# Patient Record
Sex: Female | Born: 1935 | Race: White | Hispanic: No | Smoking: Never smoker
Health system: Southern US, Community
[De-identification: ages and names within clinical notes are randomized; demographics above are authoritative.]

## PROBLEM LIST (undated history)

## (undated) DIAGNOSIS — R0601 Orthopnea: Secondary | ICD-10-CM

## (undated) DIAGNOSIS — F329 Major depressive disorder, single episode, unspecified: Secondary | ICD-10-CM

## (undated) DIAGNOSIS — R55 Syncope and collapse: Secondary | ICD-10-CM

## (undated) DIAGNOSIS — F32A Depression, unspecified: Secondary | ICD-10-CM

## (undated) DIAGNOSIS — I209 Angina pectoris, unspecified: Secondary | ICD-10-CM

## (undated) DIAGNOSIS — I10 Essential (primary) hypertension: Secondary | ICD-10-CM

## (undated) DIAGNOSIS — I499 Cardiac arrhythmia, unspecified: Secondary | ICD-10-CM

## (undated) DIAGNOSIS — R609 Edema, unspecified: Secondary | ICD-10-CM

## (undated) DIAGNOSIS — K649 Unspecified hemorrhoids: Secondary | ICD-10-CM

## (undated) DIAGNOSIS — K921 Melena: Secondary | ICD-10-CM

## (undated) DIAGNOSIS — I4891 Unspecified atrial fibrillation: Secondary | ICD-10-CM

## (undated) DIAGNOSIS — R062 Wheezing: Secondary | ICD-10-CM

## (undated) DIAGNOSIS — M1990 Unspecified osteoarthritis, unspecified site: Secondary | ICD-10-CM

## (undated) DIAGNOSIS — E785 Hyperlipidemia, unspecified: Secondary | ICD-10-CM

## (undated) DIAGNOSIS — C801 Malignant (primary) neoplasm, unspecified: Secondary | ICD-10-CM

## (undated) DIAGNOSIS — R002 Palpitations: Secondary | ICD-10-CM

## (undated) DIAGNOSIS — K219 Gastro-esophageal reflux disease without esophagitis: Secondary | ICD-10-CM

## (undated) DIAGNOSIS — IMO0001 Reserved for inherently not codable concepts without codable children: Secondary | ICD-10-CM

## (undated) DIAGNOSIS — K5790 Diverticulosis of intestine, part unspecified, without perforation or abscess without bleeding: Secondary | ICD-10-CM

## (undated) HISTORY — PX: FOOT SURGERY: SHX648

## (undated) HISTORY — PX: KNEE SURGERY: SHX244

## (undated) HISTORY — PX: NECK SURGERY: SHX720

## (undated) HISTORY — DX: Hyperlipidemia, unspecified: E78.5

## (undated) HISTORY — PX: EYE SURGERY: SHX253

## (undated) HISTORY — PX: BACK SURGERY: SHX140

## (undated) HISTORY — PX: ABDOMINAL HYSTERECTOMY: SHX81

## (undated) HISTORY — PX: JOINT REPLACEMENT: SHX530

## (undated) HISTORY — PX: TOTAL HIP ARTHROPLASTY: SHX124

## (undated) HISTORY — DX: Diverticulosis of intestine, part unspecified, without perforation or abscess without bleeding: K57.90

## (undated) HISTORY — DX: Gastro-esophageal reflux disease without esophagitis: K21.9

## (undated) HISTORY — DX: Essential (primary) hypertension: I10

## (undated) HISTORY — DX: Unspecified osteoarthritis, unspecified site: M19.90

## (undated) HISTORY — DX: Unspecified hemorrhoids: K64.9

## (undated) HISTORY — DX: Malignant (primary) neoplasm, unspecified: C80.1

## (undated) HISTORY — DX: Unspecified atrial fibrillation: I48.91

## (undated) HISTORY — DX: Melena: K92.1

---

## 2003-10-15 ENCOUNTER — Other Ambulatory Visit: Payer: Self-pay

## 2004-11-04 ENCOUNTER — Ambulatory Visit: Payer: Self-pay | Admitting: Internal Medicine

## 2005-06-03 ENCOUNTER — Emergency Department: Payer: Self-pay | Admitting: Emergency Medicine

## 2005-06-03 ENCOUNTER — Other Ambulatory Visit: Payer: Self-pay

## 2005-06-08 ENCOUNTER — Other Ambulatory Visit: Payer: Self-pay

## 2005-06-22 ENCOUNTER — Inpatient Hospital Stay: Payer: Self-pay | Admitting: Unknown Physician Specialty

## 2005-07-28 ENCOUNTER — Encounter: Payer: Self-pay | Admitting: Unknown Physician Specialty

## 2005-08-24 ENCOUNTER — Encounter: Payer: Self-pay | Admitting: Unknown Physician Specialty

## 2005-11-26 ENCOUNTER — Ambulatory Visit: Payer: Self-pay | Admitting: Internal Medicine

## 2006-09-16 ENCOUNTER — Ambulatory Visit: Payer: Self-pay | Admitting: Unknown Physician Specialty

## 2007-01-25 ENCOUNTER — Ambulatory Visit: Payer: Self-pay | Admitting: Internal Medicine

## 2008-01-31 ENCOUNTER — Ambulatory Visit: Payer: Self-pay | Admitting: Internal Medicine

## 2008-05-03 ENCOUNTER — Encounter: Payer: Self-pay | Admitting: Unknown Physician Specialty

## 2008-11-30 ENCOUNTER — Ambulatory Visit: Payer: Self-pay | Admitting: Unknown Physician Specialty

## 2009-04-23 ENCOUNTER — Ambulatory Visit: Payer: Self-pay | Admitting: Internal Medicine

## 2009-06-19 ENCOUNTER — Ambulatory Visit: Payer: Self-pay | Admitting: Unknown Physician Specialty

## 2009-07-03 ENCOUNTER — Inpatient Hospital Stay: Payer: Self-pay | Admitting: Unknown Physician Specialty

## 2009-11-02 ENCOUNTER — Ambulatory Visit: Payer: Self-pay | Admitting: Internal Medicine

## 2009-11-29 ENCOUNTER — Ambulatory Visit: Payer: Self-pay | Admitting: Unknown Physician Specialty

## 2010-01-14 ENCOUNTER — Ambulatory Visit: Payer: Self-pay | Admitting: Rheumatology

## 2010-02-28 ENCOUNTER — Emergency Department: Payer: Self-pay | Admitting: Emergency Medicine

## 2010-03-02 ENCOUNTER — Emergency Department: Payer: Self-pay | Admitting: Internal Medicine

## 2010-05-27 ENCOUNTER — Ambulatory Visit: Payer: Self-pay | Admitting: Internal Medicine

## 2011-04-05 IMAGING — CR DG CHEST 2V
1 series · 2 of 2 positions shown · non-contrast
Comparison: none

REASON FOR EXAM: htn
COMMENTS:

[Series 1: view not recorded · 0.17mm/px · 2 of 2 slices shown]
[im 1/2]
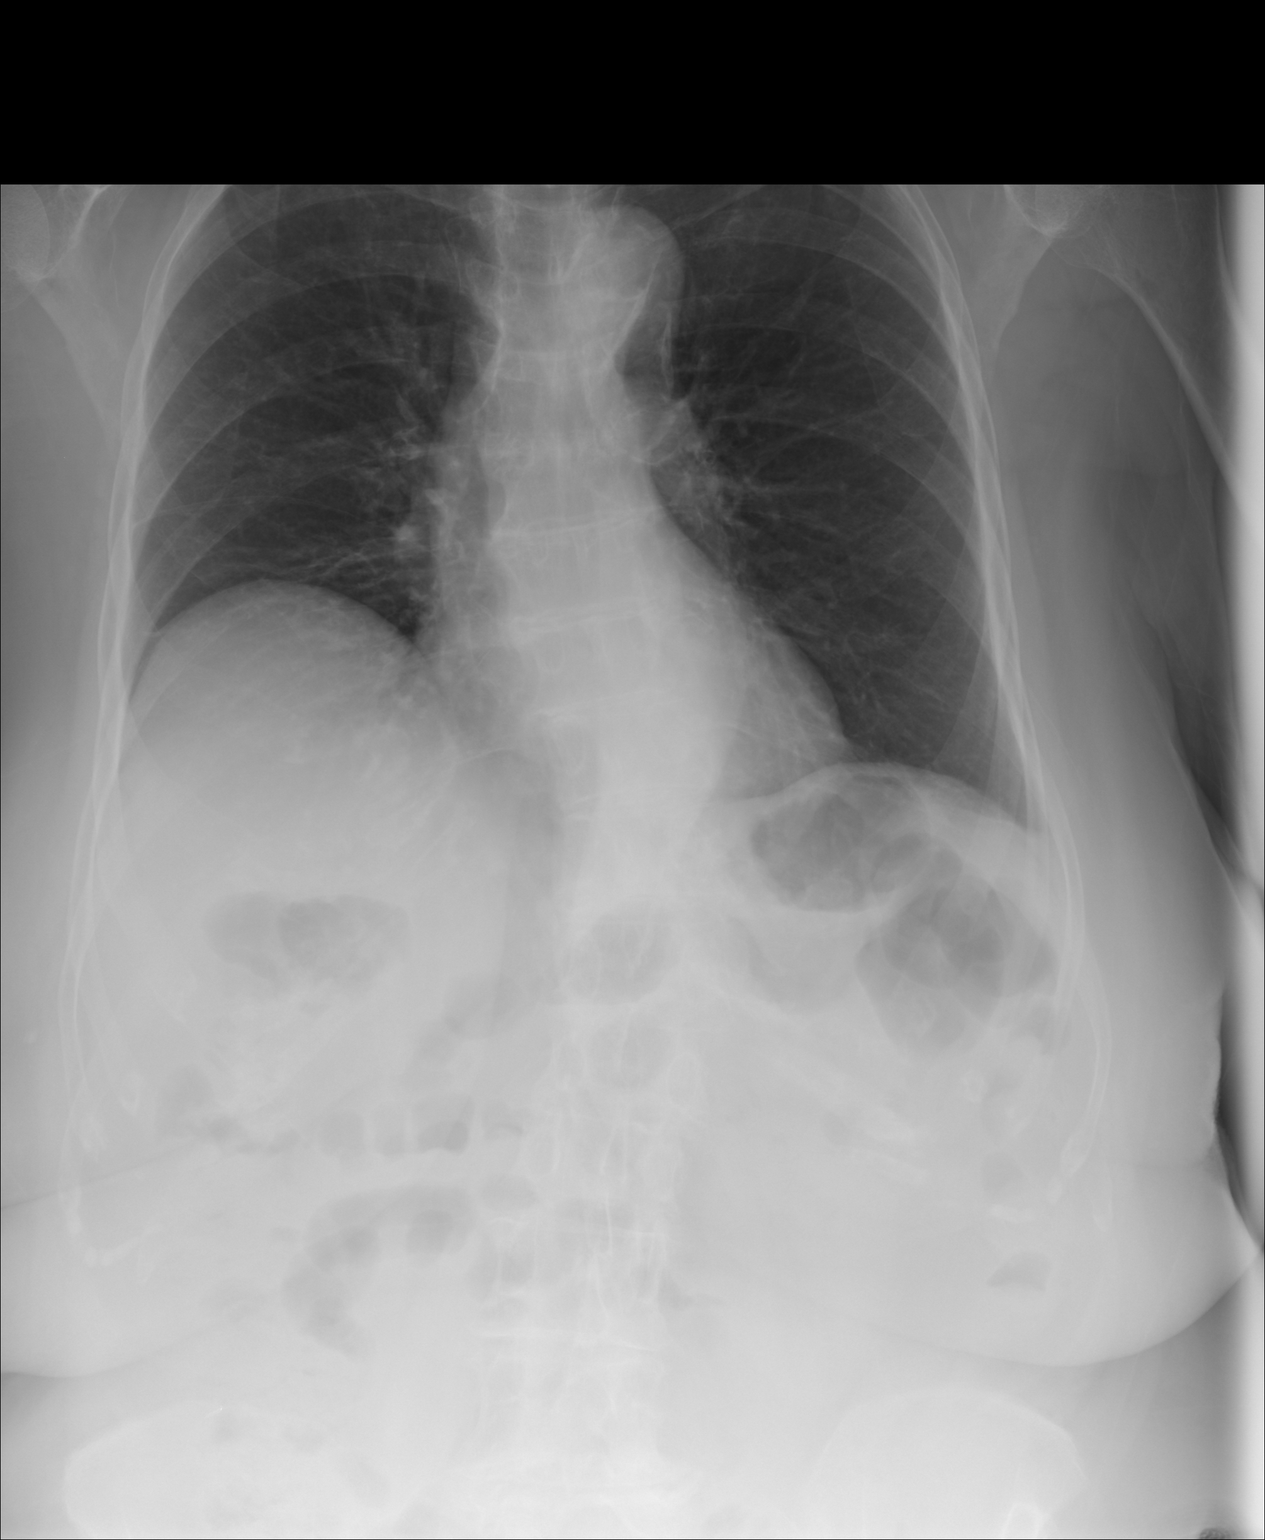
[im 2/2]
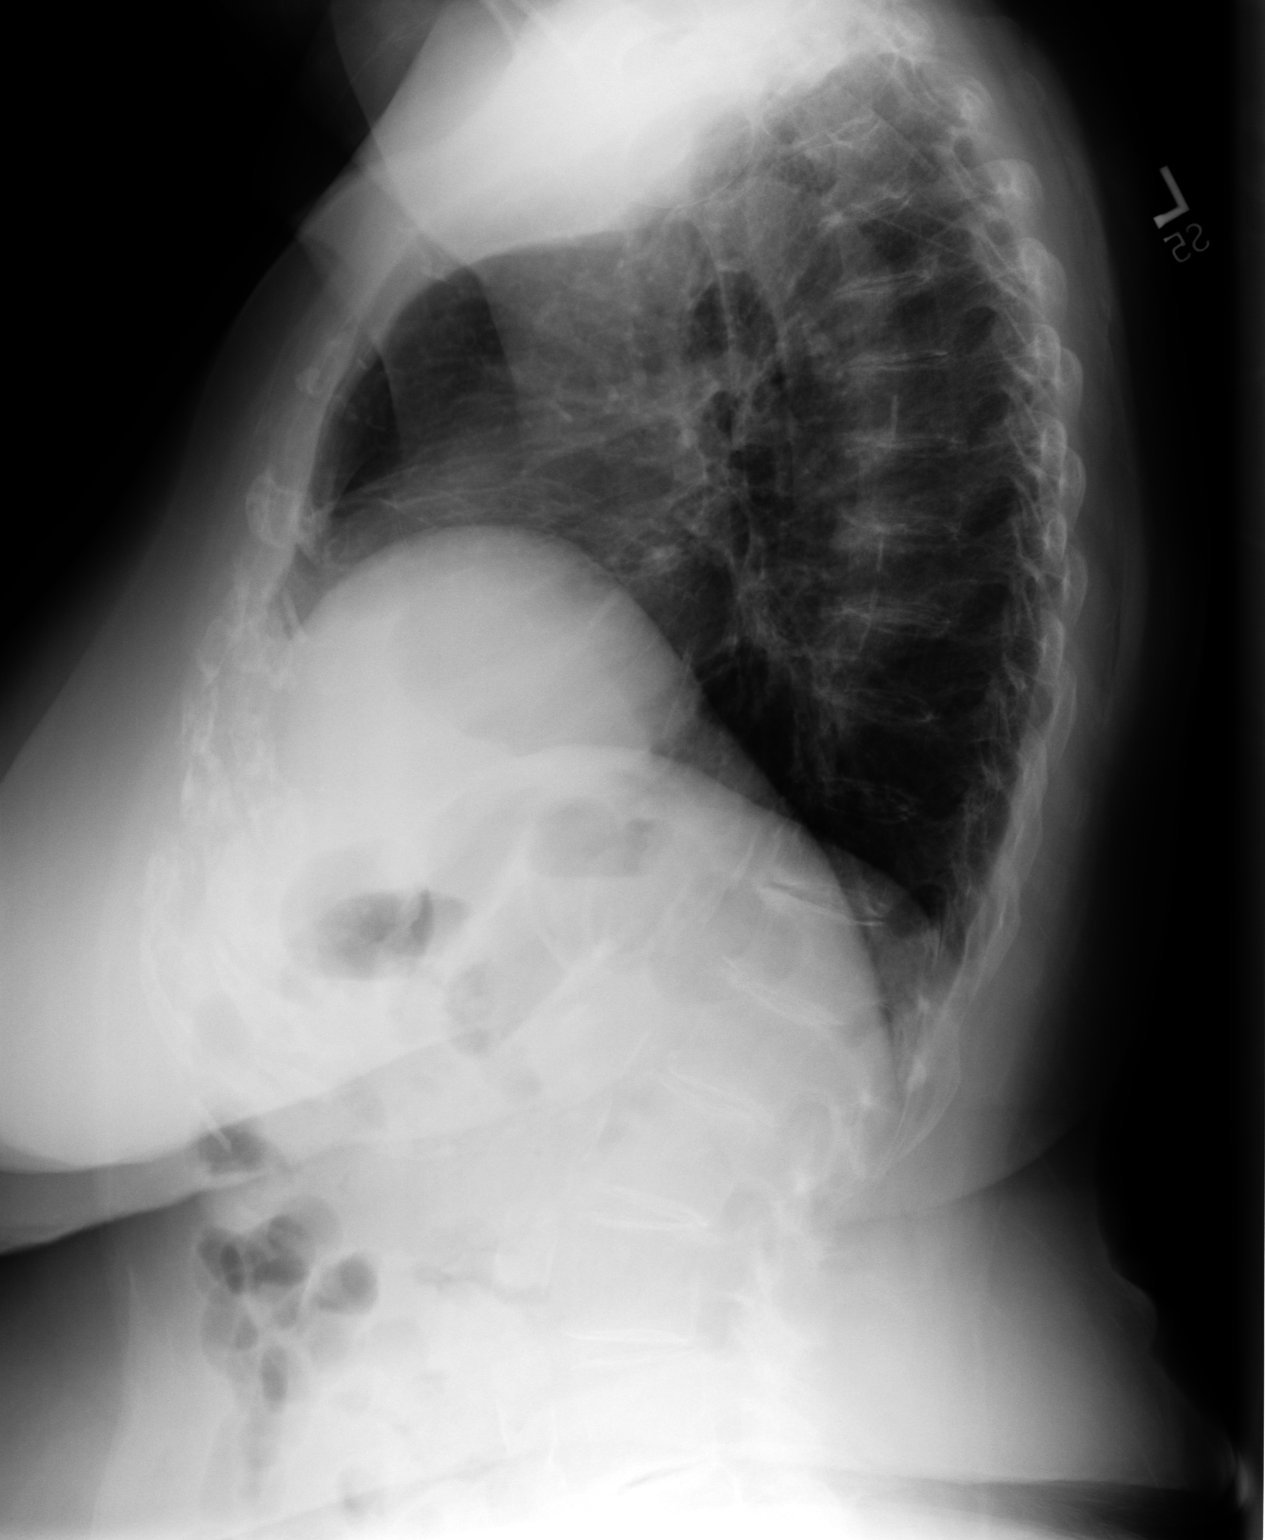

[2 of 2 positions shown; findings below may reference images not displayed]

PROCEDURE:     DXR - DXR CHEST PA (OR AP) AND LATERAL  - June 19, 2009  [DATE]

RESULT:     The left lung is well expanded and clear. The right
hemidiaphragm is elevated. The aerated portion of the right lung appears
clear. There is no pleural effusion. The cardiac silhouette is normal in
size. There is mild tortuosity of the descending thoracic aorta. There is
curvature of the thoracolumbar spine. There are degenerative changes of the
thoracic spine.
IMPRESSION: There is elevation of the right hemidiaphragm. The lungs
appear clear and the heart and pulmonary vascularity are normal in
appearance.

## 2011-05-28 ENCOUNTER — Ambulatory Visit: Payer: Self-pay | Admitting: Unknown Physician Specialty

## 2011-07-18 ENCOUNTER — Emergency Department: Payer: Self-pay | Admitting: Emergency Medicine

## 2011-10-06 ENCOUNTER — Ambulatory Visit: Payer: Self-pay | Admitting: Internal Medicine

## 2012-09-06 ENCOUNTER — Ambulatory Visit: Payer: Self-pay | Admitting: Internal Medicine

## 2012-10-20 ENCOUNTER — Ambulatory Visit: Payer: Self-pay | Admitting: Internal Medicine

## 2013-03-16 ENCOUNTER — Emergency Department: Payer: Self-pay | Admitting: Emergency Medicine

## 2013-03-16 LAB — COMPREHENSIVE METABOLIC PANEL
Anion Gap: 4 — ABNORMAL LOW (ref 7–16)
BUN: 25 mg/dL — ABNORMAL HIGH (ref 7–18)
Bilirubin,Total: 0.4 mg/dL (ref 0.2–1.0)
Creatinine: 1.36 mg/dL — ABNORMAL HIGH (ref 0.60–1.30)
Glucose: 96 mg/dL (ref 65–99)
Osmolality: 269 (ref 275–301)
SGOT(AST): 26 U/L (ref 15–37)
SGPT (ALT): 18 U/L (ref 12–78)
Sodium: 132 mmol/L — ABNORMAL LOW (ref 136–145)
Total Protein: 7.6 g/dL (ref 6.4–8.2)

## 2013-03-16 LAB — URINALYSIS, COMPLETE
Bacteria: NONE SEEN
Bilirubin,UR: NEGATIVE
Ph: 6 (ref 4.5–8.0)
Protein: NEGATIVE
RBC,UR: 1 /HPF (ref 0–5)
WBC UR: NONE SEEN /HPF (ref 0–5)

## 2013-03-16 LAB — CBC
HCT: 40.8 % (ref 35.0–47.0)
HGB: 13.6 g/dL (ref 12.0–16.0)
MCH: 31.2 pg (ref 26.0–34.0)
RBC: 4.36 10*6/uL (ref 3.80–5.20)

## 2013-06-08 ENCOUNTER — Inpatient Hospital Stay: Payer: Self-pay | Admitting: Internal Medicine

## 2013-06-08 LAB — URINALYSIS, COMPLETE
Bilirubin,UR: NEGATIVE
Leukocyte Esterase: NEGATIVE
Ph: 5 (ref 4.5–8.0)
Protein: NEGATIVE
RBC,UR: 1 /HPF (ref 0–5)
Specific Gravity: 1.006 (ref 1.003–1.030)
WBC UR: <1 /HPF (ref 0–5)

## 2013-06-08 LAB — CBC WITH DIFFERENTIAL/PLATELET
HCT: 40.4 % (ref 35.0–47.0)
HGB: 13.5 g/dL (ref 12.0–16.0)
Lymphocyte %: 17.5 %
MCH: 31.5 pg (ref 26.0–34.0)
MCHC: 33.6 g/dL (ref 32.0–36.0)
MCV: 94 fL (ref 80–100)
Monocyte #: 0.6 x10 3/mm (ref 0.2–0.9)
Monocyte %: 6.9 %
Platelet: 269 10*3/uL (ref 150–440)
RBC: 4.3 10*6/uL (ref 3.80–5.20)
RDW: 14.1 % (ref 11.5–14.5)
WBC: 9.3 10*3/uL (ref 3.6–11.0)

## 2013-06-08 LAB — COMPREHENSIVE METABOLIC PANEL
Albumin: 4.3 g/dL (ref 3.4–5.0)
Alkaline Phosphatase: 63 U/L (ref 50–136)
Anion Gap: 7 (ref 7–16)
Bilirubin,Total: 0.3 mg/dL (ref 0.2–1.0)
Calcium, Total: 9.6 mg/dL (ref 8.5–10.1)
Chloride: 98 mmol/L (ref 98–107)
Co2: 28 mmol/L (ref 21–32)
Creatinine: 1.47 mg/dL — ABNORMAL HIGH (ref 0.60–1.30)
EGFR (African American): 37 — ABNORMAL LOW
Potassium: 4.6 mmol/L (ref 3.5–5.1)
SGOT(AST): 24 U/L (ref 15–37)
SGPT (ALT): 18 U/L (ref 12–78)
Sodium: 133 mmol/L — ABNORMAL LOW (ref 136–145)

## 2013-06-09 ENCOUNTER — Telehealth: Payer: Self-pay | Admitting: *Deleted

## 2013-06-09 DIAGNOSIS — I059 Rheumatic mitral valve disease, unspecified: Secondary | ICD-10-CM

## 2013-06-09 DIAGNOSIS — I10 Essential (primary) hypertension: Secondary | ICD-10-CM

## 2013-06-09 DIAGNOSIS — R5383 Other fatigue: Secondary | ICD-10-CM

## 2013-06-09 DIAGNOSIS — I4891 Unspecified atrial fibrillation: Secondary | ICD-10-CM

## 2013-06-09 DIAGNOSIS — R5381 Other malaise: Secondary | ICD-10-CM

## 2013-06-09 DIAGNOSIS — R0602 Shortness of breath: Secondary | ICD-10-CM

## 2013-06-09 LAB — BASIC METABOLIC PANEL
Anion Gap: 4 — ABNORMAL LOW (ref 7–16)
Anion Gap: 3 — ABNORMAL LOW (ref 7–16)
BUN: 23 mg/dL — ABNORMAL HIGH (ref 7–18)
Calcium, Total: 9.6 mg/dL (ref 8.5–10.1)
Co2: 31 mmol/L (ref 21–32)
EGFR (African American): 38 — ABNORMAL LOW
EGFR (Non-African Amer.): 33 — ABNORMAL LOW
EGFR (Non-African Amer.): 33 — ABNORMAL LOW
Glucose: 93 mg/dL (ref 65–99)
Osmolality: 275 (ref 275–301)
Potassium: 4.5 mmol/L (ref 3.5–5.1)
Sodium: 137 mmol/L (ref 136–145)
Sodium: 136 mmol/L (ref 136–145)

## 2013-06-09 LAB — APTT
Activated PTT: 33.1 secs (ref 23.6–35.9)
Activated PTT: 142.5 secs — ABNORMAL HIGH (ref 23.6–35.9)

## 2013-06-09 LAB — LIPID PANEL
HDL Cholesterol: 52 mg/dL (ref 40–60)
VLDL Cholesterol, Calc: 19 mg/dL (ref 5–40)

## 2013-06-09 LAB — CBC WITH DIFFERENTIAL/PLATELET
Basophil #: 0 10*3/uL (ref 0.0–0.1)
Basophil #: 0.1 10*3/uL (ref 0.0–0.1)
Basophil %: 0.6 %
Basophil %: 0.8 %
Eosinophil #: 0.3 10*3/uL (ref 0.0–0.7)
Eosinophil %: 4.4 %
HGB: 11.9 g/dL — ABNORMAL LOW (ref 12.0–16.0)
Lymphocyte #: 1.4 10*3/uL (ref 1.0–3.6)
Lymphocyte %: 16.3 %
Lymphocyte %: 17.5 %
MCH: 31.6 pg (ref 26.0–34.0)
MCHC: 34.1 g/dL (ref 32.0–36.0)
MCHC: 33.6 g/dL (ref 32.0–36.0)
Monocyte #: 0.7 x10 3/mm (ref 0.2–0.9)
Monocyte #: 0.6 x10 3/mm (ref 0.2–0.9)
Monocyte %: 7.6 %
Neutrophil #: 5.5 10*3/uL (ref 1.4–6.5)
Neutrophil #: 5.8 10*3/uL (ref 1.4–6.5)
Neutrophil %: 70 %
Neutrophil %: 70.1 %
Platelet: 234 10*3/uL (ref 150–440)
RBC: 3.71 10*6/uL — ABNORMAL LOW (ref 3.80–5.20)
RDW: 14.2 % (ref 11.5–14.5)
WBC: 8.3 10*3/uL (ref 3.6–11.0)

## 2013-06-09 LAB — TSH: Thyroid Stimulating Horm: 1.74 u[IU]/mL

## 2013-06-09 LAB — CK TOTAL AND CKMB (NOT AT ARMC)
CK, Total: 79 U/L (ref 21–215)
CK, Total: 101 U/L (ref 21–215)
CK-MB: 2.1 ng/mL (ref 0.5–3.6)

## 2013-06-09 LAB — TROPONIN I
Troponin-I: <0.02 ng/mL
Troponin-I: <0.02 ng/mL

## 2013-06-09 NOTE — Telephone Encounter (Signed)
Pt's daughter called from [place].  States that CCU told her to call our office for information on pt's status, as Dr. Mariah Milling was called for consult.  She would appreciate a call from [place], [place], if possible, as he saw pt in consult.  She would like to know if pt's condition is "serious" and if she needs to come be with her mother.

## 2013-06-09 NOTE — Telephone Encounter (Signed)
Please call daughter as her mother has been admitted into [facility]U and wants to know if she need to come to [facility]

## 2013-06-10 LAB — CBC WITH DIFFERENTIAL/PLATELET
HGB: 11.9 g/dL — ABNORMAL LOW (ref 12.0–16.0)
Lymphocyte #: 1.4 10*3/uL (ref 1.0–3.6)
MCH: 31.7 pg (ref 26.0–34.0)
MCHC: 33.6 g/dL (ref 32.0–36.0)
Monocyte #: 0.6 x10 3/mm (ref 0.2–0.9)
Monocyte %: 8.8 %
Neutrophil #: 4.8 10*3/uL (ref 1.4–6.5)
Neutrophil %: 65.4 %
Platelet: 238 10*3/uL (ref 150–440)
RBC: 3.76 10*6/uL — ABNORMAL LOW (ref 3.80–5.20)
RDW: 14 % (ref 11.5–14.5)
WBC: 7.4 10*3/uL (ref 3.6–11.0)

## 2013-06-16 ENCOUNTER — Telehealth: Payer: Self-pay | Admitting: *Deleted

## 2013-06-16 NOTE — Telephone Encounter (Signed)
Spoke w/ pt.  She states that she was recently released from [facility] for rectal bleeding.  Reports bleeding since yesterday, "terrrible yesterday morning", "right much blood on the paper and running down my leg when I stood up". Reports not as bad this morning, but did have a nose bleed and used a "couple of kleenex to stop". After speaking w/ Dr. Mariah Milling, instructed pt to hold Pradaxa until her appt w/ Dr. Mariah Milling on Tuesday.  Pt is agreeable to this plan.

## 2013-06-16 NOTE — Telephone Encounter (Signed)
Dr. Mariah Milling put her on a blood thinner Pradaxa 75mg  at her hospital stay. She has hemmroids and is bleeding worse and has had a nose bleed. Please advise

## 2013-06-20 ENCOUNTER — Encounter: Payer: Self-pay | Admitting: Cardiovascular Disease

## 2013-06-20 ENCOUNTER — Ambulatory Visit (INDEPENDENT_AMBULATORY_CARE_PROVIDER_SITE_OTHER): Payer: Medicare Other | Admitting: Cardiovascular Disease

## 2013-06-20 VITALS — BP 148/72 | HR 75 | Ht 64.0 in | Wt 164.5 lb

## 2013-06-20 DIAGNOSIS — R0602 Shortness of breath: Secondary | ICD-10-CM

## 2013-06-20 DIAGNOSIS — I10 Essential (primary) hypertension: Secondary | ICD-10-CM | POA: Insufficient documentation

## 2013-06-20 DIAGNOSIS — R600 Localized edema: Secondary | ICD-10-CM

## 2013-06-20 DIAGNOSIS — I4891 Unspecified atrial fibrillation: Secondary | ICD-10-CM

## 2013-06-20 DIAGNOSIS — N189 Chronic kidney disease, unspecified: Secondary | ICD-10-CM | POA: Insufficient documentation

## 2013-06-20 DIAGNOSIS — E785 Hyperlipidemia, unspecified: Secondary | ICD-10-CM

## 2013-06-20 DIAGNOSIS — R609 Edema, unspecified: Secondary | ICD-10-CM

## 2013-06-20 MED ORDER — POTASSIUM CHLORIDE CRYS ER 10 MEQ PO TBCR: 10.0000 meq | EXTENDED_RELEASE_TABLET | Freq: Two times a day (BID) | ORAL | Status: DC | PRN

## 2013-06-20 MED ORDER — FUROSEMIDE 20 MG PO TABS: 20.0000 mg | ORAL_TABLET | Freq: Two times a day (BID) | ORAL | Status: DC | PRN

## 2013-06-20 NOTE — Assessment & Plan Note (Signed)
Suspect she has some fluid overload. She reports weight is up. We'll do Lasix twice a day for one week, then down to daily

## 2013-06-20 NOTE — Patient Instructions (Addendum)
You are doing well.  Stool softeners: Try MOM, miralex Colace is ok but weak  Please take lasix twice a day with potassium for leg edema (for one week) Then down to lasix one a day  Call the office to schedule blood work in 2 weeks  Please call us if you have new issues that need to be addressed before your next appt.  Your physician wants you to follow-up in: 1 month.

## 2013-06-20 NOTE — Progress Notes (Signed)
Patient ID: Tabitha Potts, female    DOB: Jan 15, 1926, 79 y.o.   MRN: [id number]  HPI Comments: Tabitha Potts is a very pleasant 79 year old woman with a history of hemorrhoidal bleeding who presented to the hospital 06/09/2013 with GI bleeding felt secondary to hemorrhoids and diverticulitis, new onset atrial fibrillation with RVR.  She reported 3 weeks of fatigue. It was felt that she may have been in atrial fibrillation for several weeks. She was started on Cardizem, initial digoxin but this was later held. Also started on anticoagulation. Norvasc and ACE inhibitor HCTZ combo was held  for low blood pressure. Creatinine in the hospital 1.47, BUN 26   In followup today, she reports that she feels better. Sometimes feels a pulsation in her head, most commonly in the setting of stress. She did have more GI bleeding and she held the anticoagulation with improvement of her symptoms .  Echocardiogram in the hospital 06/09/2013 shows ejection fraction 60-65%, normal right ventricular systolic pressure, mildly dilated left atrium  Hematocrit 06/10/2013 was 35   Notes provided from outside clinic show stress test in 2009  Echocardiogram 04/13/2013 with same results as above   EKG shows normal sinus rhythm with rate 75 beats per minute, first degree AV block. No significant ST or T wave changes   Outpatient Encounter Prescriptions as of 06/20/2013  Medication Sig Dispense Refill  . ALPRAZolam (XANAX) 0.25 MG tablet Take 0.25 mg by mouth at bedtime as needed.       Marland Kitchen aluminum & magnesium hydroxide (MAALOX) 225-200 MG/5ML suspension Take 5 mLs by mouth every 6 (six) hours as needed for indigestion.      Marland Kitchen aspirin 81 MG tablet Take 81 mg by mouth daily.      Marland Kitchen diltiazem (DILACOR XR) 240 MG 24 hr capsule Take 240 mg by mouth daily.      . Multiple Vitamin (MULTIVITAMIN) tablet Take 1 tablet by mouth daily.      . NON FORMULARY Stool Softner 100 mg twice daily.      . potassium chloride (K-DUR,KLOR-CON) 10 MEQ  tablet Take 1 tablet (10 mEq total) by mouth 2 (two) times daily as needed.  60 tablet  6  . rosuvastatin (CRESTOR) 10 MG tablet Take 10 mg by mouth daily.      . furosemide (LASIX) 20 MG tablet Take 20 mg by mouth daily.       . potassium chloride (K-DUR,KLOR-CON) 10 MEQ tablet Take 10 mEq by mouth daily.          Review of Systems  Constitutional: Negative.   HENT: Negative.   Eyes: Negative.   Respiratory: Negative.   Cardiovascular: Negative.   Gastrointestinal: Negative.   Endocrine: Negative.   Musculoskeletal: Negative.   Skin: Negative.   Allergic/Immunologic: Negative.   Neurological: Negative.   Hematological: Negative.   Psychiatric/Behavioral: Negative.   All other systems reviewed and are negative.    BP 148/72  Pulse 75  Ht 5\' 4"  (1.626 m)  Wt 164 lb 8 oz (74.617 kg)  BMI 28.22 kg/m2  Physical Exam  Nursing note and vitals reviewed. Constitutional: She is oriented to person, place, and time. She appears well-developed and well-nourished.  HENT:  Head: Normocephalic.  Nose: Nose normal.  Mouth/Throat: Oropharynx is clear and moist.  Eyes: Conjunctivae are normal. Pupils are equal, round, and reactive to light.  Neck: Normal range of motion. Neck supple. No JVD present.  Cardiovascular: Normal rate, regular rhythm, S1 normal, S2 normal, normal heart sounds  and intact distal pulses.  Exam reveals no gallop and no friction rub.   No murmur heard. Pulmonary/Chest: Effort normal and breath sounds normal. No respiratory distress. She has no wheezes. She has no rales. She exhibits no tenderness.  Abdominal: Soft. Bowel sounds are normal. She exhibits no distension. There is no tenderness.  Musculoskeletal: Normal range of motion. She exhibits no edema and no tenderness.  Lymphadenopathy:    She has no cervical adenopathy.  Neurological: She is alert and oriented to person, place, and time. Coordination normal.  Skin: Skin is warm and dry. No rash noted. No  erythema.  Psychiatric: She has a normal mood and affect. Her behavior is normal. Judgment and thought content normal.    Assessment and Plan

## 2013-06-20 NOTE — Assessment & Plan Note (Signed)
Suggested she stay on her Crestor

## 2013-06-20 NOTE — Assessment & Plan Note (Signed)
We will recheck her renal function in 2 weeks' time given increased Lasix over the next week and underlying renal dysfunction

## 2013-06-20 NOTE — Assessment & Plan Note (Addendum)
On today's visit, she is in normal sinus rhythm. We'll continue her on diltiazem 240 mg daily. For her leg edema, we have recommended she increase her Lasix up to 20 mg twice a day with potassium 10 mEq twice a day for one week. We have suggested she call our office next week. If no improvement with diuresis, we may need to decrease the dose of diltiazem. Unable to tolerate any correlation well given hemorrhoidal bleeding, possibly diverticular bleeding.

## 2013-06-20 NOTE — Assessment & Plan Note (Signed)
For now we have suggested she hold her ACE inhibitor HCTZ combo. Continue diltiazem.

## 2013-06-26 ENCOUNTER — Encounter: Payer: Self-pay | Admitting: *Deleted

## 2013-06-27 ENCOUNTER — Ambulatory Visit (INDEPENDENT_AMBULATORY_CARE_PROVIDER_SITE_OTHER): Payer: Medicare Other

## 2013-06-27 DIAGNOSIS — I4891 Unspecified atrial fibrillation: Secondary | ICD-10-CM

## 2013-06-28 ENCOUNTER — Telehealth: Payer: Self-pay | Admitting: *Deleted

## 2013-06-28 NOTE — Telephone Encounter (Signed)
Patient called and PCP Dr. Valrie Hart put her on 20mg  benecar for hbp

## 2013-07-04 ENCOUNTER — Other Ambulatory Visit: Payer: Medicare Other

## 2013-07-06 ENCOUNTER — Telehealth: Payer: Self-pay | Admitting: *Deleted

## 2013-07-06 ENCOUNTER — Other Ambulatory Visit: Payer: Self-pay | Admitting: *Deleted

## 2013-07-06 MED ORDER — DILTIAZEM HCL ER 240 MG PO CP24: 240.0000 mg | ORAL_CAPSULE | Freq: Every day | ORAL | Status: DC

## 2013-07-06 NOTE — Telephone Encounter (Signed)
Okay could

## 2013-07-06 NOTE — Telephone Encounter (Signed)
Patient called stating that she is doing good bp 117/81 pulse 73.

## 2013-07-06 NOTE — Telephone Encounter (Signed)
Previous note open regarding BP. Will combine and close this note.

## 2013-07-06 NOTE — Telephone Encounter (Signed)
Will forward to MD for review

## 2013-07-06 NOTE — Telephone Encounter (Signed)
Tabitha Potts at 07/06/2013 9:14 AM    Status: Signed        Patient called stating that she is doing good bp 117/81 pulse 73.

## 2013-07-10 ENCOUNTER — Telehealth: Payer: Self-pay

## 2013-07-10 NOTE — Telephone Encounter (Signed)
Spoke w/ pt.  She is agreeable to plan and verbalizes understanding. 

## 2013-07-10 NOTE — Telephone Encounter (Signed)
Wouldn't stay on diltiazem 240 mg daily Would start Lasix as recommended on her visit If leg edema continues to be significant in the next several weeks,  May need to decrease diltiazem as in one of 5 people this can cause leg edema  We do not to Xanax, this should come from primary care  Would make sure she has followup in the next several weeks to look at leg edema This can get worse with time

## 2013-07-10 NOTE — Telephone Encounter (Signed)
Patient was giving at D/C from [facility] on 06/10/2013 with Diltiazem CD 300 mg take one tablet daily. She recently picked up a Rx for Diltiazem xr ER  240 mg take one tablet daily. What dose is she to be taking.  She also would like to know if you would send in xanax until she gets her nerves straightened out.

## 2013-07-24 ENCOUNTER — Ambulatory Visit (INDEPENDENT_AMBULATORY_CARE_PROVIDER_SITE_OTHER): Payer: Medicare Other | Admitting: Cardiovascular Disease

## 2013-07-24 ENCOUNTER — Encounter: Payer: Self-pay | Admitting: Cardiovascular Disease

## 2013-07-24 VITALS — BP 128/62 | HR 86 | Ht 63.0 in | Wt 164.8 lb

## 2013-07-24 DIAGNOSIS — I10 Essential (primary) hypertension: Secondary | ICD-10-CM

## 2013-07-24 DIAGNOSIS — I4891 Unspecified atrial fibrillation: Secondary | ICD-10-CM

## 2013-07-24 DIAGNOSIS — E785 Hyperlipidemia, unspecified: Secondary | ICD-10-CM

## 2013-07-24 MED ORDER — APIXABAN 5 MG PO TABS: 5.0000 mg | ORAL_TABLET | Freq: Two times a day (BID) | ORAL | Status: DC

## 2013-07-24 NOTE — Assessment & Plan Note (Signed)
Encouraged her to stay on her Crestor 

## 2013-07-24 NOTE — Patient Instructions (Addendum)
Please hold the aspirin Tomorrow start eliquis 1/2 pill twice a day for four days,  then increase to a full pill twice a day on Saturday  Please call us if you have new issues that need to be addressed before your next appt.  Your physician wants you to follow-up in: 4 weeks

## 2013-07-24 NOTE — Progress Notes (Signed)
Patient ID: Tabitha Potts, female    DOB: June 05, 1926, 79 y.o.   MRN: [id number]  HPI Comments: Tabitha Potts is a very pleasant 79 year old woman with a history of hemorrhoidal bleeding who presented to the hospital 06/09/2013 with GI bleeding felt secondary to hemorrhoids and diverticulitis, new onset atrial fibrillation with RVR. It was felt she had developed atrial fibrillation in early October 2014. Prior EKGs earlier in 2014, July, had shown normal sinus rhythm.In the hospital, She was started on Cardizem, initial digoxin but this was later held. Also started on anticoagulation. Norvasc and ACE inhibitor HCTZ combo was held  for low blood pressure. Creatinine in the hospital 1.47, BUN 26   In followup today, she continues to have periods of fatigue. Symptoms seem to wax and wane. Otherwise she feels well. She denies any significant lower to come edema, no orthopnea or PND. She is able to exert herself to a moderate degree without having to stop.   On her last clinic visit, She did have more GI bleeding and she held the anticoagulation with improvement of her symptoms .  Echocardiogram in the hospital 06/09/2013 shows ejection fraction 60-65%, normal right ventricular systolic pressure, mildly dilated left atrium  Hematocrit 06/10/2013 was 35   Notes provided from outside clinic show stress test in 2009  Echocardiogram 04/13/2013 with same results as above   EKG shows atrial fibrillation with rates in the 80s Prior EKG 06/20/2013 showed normal sinus rhythm EKG prior to that in the hospital in mid-October showed atrial fibrillation EKG July 2014 showed normal sinus rhythm  Outpatient Encounter Prescriptions as of 07/24/2013  Medication Sig  . ALPRAZolam (XANAX) 0.25 MG tablet Take 0.25 mg by mouth at bedtime as needed.   Marland Kitchen aluminum & magnesium hydroxide (MAALOX) 225-200 MG/5ML suspension Take 5 mLs by mouth every 6 (six) hours as needed for indigestion.  Marland Kitchen aspirin 81 MG tablet Take 81 mg by mouth  daily.  Marland Kitchen diltiazem (DILACOR XR) 240 MG 24 hr capsule Take 1 capsule (240 mg total) by mouth daily.  . furosemide (LASIX) 20 MG tablet Take 1 tablet (20 mg total) by mouth 2 (two) times daily as needed.  . Multiple Vitamin (MULTIVITAMIN) tablet Take 1 tablet by mouth daily.  . NON FORMULARY Stool Softner 100 mg twice daily.  . rosuvastatin (CRESTOR) 10 MG tablet Take 10 mg by mouth daily.  . [DISCONTINUED] potassium chloride (K-DUR,KLOR-CON) 10 MEQ tablet Take 1 tablet (10 mEq total) by mouth 2 (two) times daily as needed.      Review of Systems  Constitutional: Negative.   HENT: Negative.   Eyes: Negative.   Respiratory: Negative.   Cardiovascular: Negative.   Gastrointestinal: Negative.   Endocrine: Negative.   Musculoskeletal: Negative.   Skin: Negative.   Allergic/Immunologic: Negative.   Neurological: Negative.   Hematological: Negative.   Psychiatric/Behavioral: Negative.   All other systems reviewed and are negative.    BP 128/62  Pulse 86  Ht 5\' 3"  (1.6 m)  Wt 164 lb 12 oz (74.73 kg)  BMI 29.19 kg/m2  Physical Exam  Nursing note and vitals reviewed. Constitutional: She is oriented to person, place, and time. She appears well-developed and well-nourished.  HENT:  Head: Normocephalic.  Nose: Nose normal.  Mouth/Throat: Oropharynx is clear and moist.  Eyes: Conjunctivae are normal. Pupils are equal, round, and reactive to light.  Neck: Normal range of motion. Neck supple. No JVD present.  Cardiovascular: Normal rate, regular rhythm, S1 normal, S2 normal, normal heart sounds  and intact distal pulses.  Exam reveals no gallop and no friction rub.   No murmur heard. Pulmonary/Chest: Effort normal and breath sounds normal. No respiratory distress. She has no wheezes. She has no rales. She exhibits no tenderness.  Abdominal: Soft. Bowel sounds are normal. She exhibits no distension. There is no tenderness.  Musculoskeletal: Normal range of motion. She exhibits no edema  and no tenderness.  Lymphadenopathy:    She has no cervical adenopathy.  Neurological: She is alert and oriented to person, place, and time. Coordination normal.  Skin: Skin is warm and dry. No rash noted. No erythema.  Psychiatric: She has a normal mood and affect. Her behavior is normal. Judgment and thought content normal.    Assessment and Plan

## 2013-07-24 NOTE — Assessment & Plan Note (Signed)
She has converted back to atrial fibrillation again. She does have symptoms of fatigue though in general is not able to appreciate her atrial fibrillation. We will hold her aspirin, start her on eliquis 5 mg twice a day as he has paroxysmal arrhythmia.

## 2013-07-24 NOTE — Assessment & Plan Note (Signed)
Blood pressure is well controlled on today's visit. No changes made to the medications. 

## 2013-07-28 ENCOUNTER — Telehealth: Payer: Self-pay | Admitting: *Deleted

## 2013-07-28 NOTE — Telephone Encounter (Signed)
Patient calling concerning her blood pressure and heart rate: Blood pressure this morning was 167/110. Would like to speak with Dr. Windell Hummingbird RN

## 2013-07-28 NOTE — Telephone Encounter (Signed)
Spoke w/ pt.  She reports that she does not normally check her BP, but her daughter is a doctor in [facility] and asked her to check it today. Reports that she did not feel good this morning, felt "draggy" and didn't want to eat or drink her coffee. BP this am 167/110 HR 114. Denies headache, chest pain or SOB.  States that her grand daughter is a Engineer, civil (consulting).  She worked last night and pt will have to call and wake her up to bring her here for an appt.  Spoke w/ Dr. Mariah Milling who advised that pt try to relax for a little while and recheck readings, as rushing to come up here may send BP up even more. Pt seemed to calm down a bit and agrees to do this. Pt verbalizes that she will call her grand daughter to come keep her company and will call in about an hour with new readings.

## 2013-07-28 NOTE — Telephone Encounter (Signed)
Spoke w/ pt.  She reports that she feels much better than she did this afternoon. Reports BP today was 152/98, HR 128 but came down to 117/80 w HR 71. Reports that her daugther "gets on her all the time" and tells her to check her BP more regularly, but it upsets pt to do so. Informed pt the Dr. Mariah Milling prefers pts to check BP 1-2 times per week. Pt would prefer this, as she gets anxious once she knows the actual number. Advised pt to relax this weekend, write her readings down IF she checks it, and call Monday if she continues to be symptomatic. Pt is agreeable to this plan and will call with any questions or concerns.

## 2013-08-03 ENCOUNTER — Telehealth: Payer: Self-pay | Admitting: *Deleted

## 2013-08-03 NOTE — Telephone Encounter (Signed)
Patient having increased bleeding from hemoroids. She thinks its from the increase on her blood thinner. Please advise.

## 2013-08-03 NOTE — Telephone Encounter (Signed)
Pt reports that she has not had any bleeding since she left the hospital, but yesterday had a "little blood". This morning reports "right much blood".  Saw her GI doctor, who she states advised her to take stool softeners. Pt really like the Eliquis and states that she feels good, but is concerned about the bleeding. She has not taken her meds yet today. She would like to go back to 1/2 pill bid for a few days to see if bleeding resolves. She verbalizes understanding of risks of stopping anticoag and would like to "reset" her schedule and gradually increase back up to a whole pill. She would like a call today that Dr. Mariah Milling is aware and agreeable to this plan or if he has any other suggestions about her bleeding. Please advise.  Thank you!

## 2013-08-07 NOTE — Telephone Encounter (Signed)
Would be okay for short period on the half pill Could add MiraLAX in addition to stool softeners

## 2013-08-08 NOTE — Telephone Encounter (Signed)
Spoke w/ pt.  She is agreeable to adding Miralax in addition to her stool softeners. She just started a whole pill of Eliquis yesterday and has not noticed any more bleeding.  Pt to call if she has any additional concerns.

## 2013-08-22 ENCOUNTER — Ambulatory Visit: Payer: Medicare Other | Admitting: Cardiovascular Disease

## 2013-08-24 HISTORY — PX: COLONOSCOPY: SHX174

## 2013-08-24 HISTORY — PX: CARDIOVERSION: SHX1299

## 2013-08-29 ENCOUNTER — Ambulatory Visit (INDEPENDENT_AMBULATORY_CARE_PROVIDER_SITE_OTHER): Payer: Medicare Other | Admitting: Cardiovascular Disease

## 2013-08-29 ENCOUNTER — Encounter: Payer: Self-pay | Admitting: Cardiovascular Disease

## 2013-08-29 VITALS — BP 134/78 | HR 88 | Ht 64.0 in | Wt 167.2 lb

## 2013-08-29 DIAGNOSIS — I4891 Unspecified atrial fibrillation: Secondary | ICD-10-CM

## 2013-08-29 DIAGNOSIS — R600 Localized edema: Secondary | ICD-10-CM

## 2013-08-29 DIAGNOSIS — R609 Edema, unspecified: Secondary | ICD-10-CM

## 2013-08-29 DIAGNOSIS — E785 Hyperlipidemia, unspecified: Secondary | ICD-10-CM

## 2013-08-29 DIAGNOSIS — I10 Essential (primary) hypertension: Secondary | ICD-10-CM

## 2013-08-29 MED ORDER — AMIODARONE HCL 200 MG PO TABS: 200.0000 mg | ORAL_TABLET | Freq: Two times a day (BID) | ORAL | Status: DC

## 2013-08-29 NOTE — Patient Instructions (Addendum)
You are still in atrial fibrillation Please start amiodarone 400 mg twice a day for 5 days Then decrease down to 200 mg twice a day  Continue on your other medications  Please call us if you have new issues that need to be addressed before your next appt.  Your physician wants you to follow-up in: 2 weeks

## 2013-08-29 NOTE — Assessment & Plan Note (Signed)
We have encouraged her to stay on her Crestor

## 2013-08-29 NOTE — Assessment & Plan Note (Signed)
She has not converted back to normal sinus rhythm. We'll start amiodarone 400 mg by mouth twice a day for 5 days then down to 200 mg twice a day with followup in clinic in 2 weeks' time.

## 2013-08-29 NOTE — Assessment & Plan Note (Signed)
Blood pressure is well controlled on today's visit. No changes made to the medications. 

## 2013-08-29 NOTE — Progress Notes (Signed)
Patient ID: Tabitha Potts, female    DOB: 12/22/1925, 80 y.o.   MRN: [id number]  HPI Comments: Tabitha Potts is a very pleasant 80 year old woman with a history of hemorrhoidal bleeding who presented to the hospital 06/09/2013 with GI bleeding felt secondary to hemorrhoids and diverticulitis, new onset atrial fibrillation with RVR. It was felt she had developed atrial fibrillation in early October 2014. Prior EKGs earlier in 2014, July, had shown normal sinus rhythm.In the hospital, She was started on Cardizem, initial digoxin but this was later held. Also started on anticoagulation. Norvasc and ACE inhibitor HCTZ combo was held  for low blood pressure. Creatinine in the hospital 1.47, BUN 26   In followup today, she continues to be in atrial fibrillation. She does not feel well, has edema, palpitations, general malaise. Symptoms of palpitations worse at night. She has been taking Lasix as needed for leg edema with good results. She is interested in options to go back to normal rhythm as she does not like the way she feels. She does report having a allergy to CT contrast back in 2009.  Previous GI bleeding, possible hemorrhoid  Echocardiogram in the hospital 06/09/2013 shows ejection fraction 60-65%, normal right ventricular systolic pressure, mildly dilated left atrium  Hematocrit 06/10/2013 was 35   Notes provided from outside clinic show stress test in 2009  Echocardiogram 04/13/2013 with same results as above    Prior EKG 06/20/2013 showed normal sinus rhythm EKG prior to that in the hospital in mid-October showed atrial fibrillation EKG July 2014 showed normal sinus rhythm EKG today shows atrial fibrillation with rate 88 beats per minute   Outpatient Encounter Prescriptions as of 08/29/2013  Medication Sig  . acetaminophen (TYLENOL) 325 MG tablet Take 650 mg by mouth every 6 (six) hours as needed.  . ALPRAZolam (XANAX) 0.25 MG tablet Take 0.25 mg by mouth at bedtime as needed.   Marland Kitchen apixaban  (ELIQUIS) 5 MG TABS tablet Take 1 tablet (5 mg total) by mouth 2 (two) times daily.  Marland Kitchen diltiazem (DILACOR XR) 240 MG 24 hr capsule Take 1 capsule (240 mg total) by mouth daily.  . furosemide (LASIX) 20 MG tablet Take 20 mg by mouth daily.  . Multiple Vitamin (MULTIVITAMIN) tablet Take 1 tablet by mouth daily.  . NON FORMULARY Stool Softner 100 mg twice daily.  . rosuvastatin (CRESTOR) 10 MG tablet Take 10 mg by mouth daily.  . [DISCONTINUED] aluminum & magnesium hydroxide (MAALOX) 225-200 MG/5ML suspension Take 5 mLs by mouth every 6 (six) hours as needed for indigestion.      Review of Systems  Constitutional: Positive for fatigue.  HENT: Negative.   Eyes: Negative.   Respiratory: Positive for shortness of breath.   Cardiovascular: Positive for palpitations and leg swelling.  Gastrointestinal: Negative.   Endocrine: Negative.   Musculoskeletal: Negative.   Skin: Negative.   Allergic/Immunologic: Negative.   Neurological: Negative.   Hematological: Negative.   Psychiatric/Behavioral: Negative.   All other systems reviewed and are negative.    BP 134/78  Pulse 88  Ht 5\' 4"  (1.626 m)  Wt 167 lb 4 oz (75.864 kg)  BMI 28.69 kg/m2  Physical Exam  Nursing note and vitals reviewed. Constitutional: She is oriented to person, place, and time. She appears well-developed and well-nourished.  HENT:  Head: Normocephalic.  Nose: Nose normal.  Mouth/Throat: Oropharynx is clear and moist.  Eyes: Conjunctivae are normal. Pupils are equal, round, and reactive to light.  Neck: Normal range of motion. Neck supple.  No JVD present.  Cardiovascular: Normal rate, regular rhythm, S1 normal, S2 normal, normal heart sounds and intact distal pulses.  Exam reveals no gallop and no friction rub.   No murmur heard. Pulmonary/Chest: Effort normal and breath sounds normal. No respiratory distress. She has no wheezes. She has no rales. She exhibits no tenderness.  Abdominal: Soft. Bowel sounds are  normal. She exhibits no distension. There is no tenderness.  Musculoskeletal: Normal range of motion. She exhibits no edema and no tenderness.  Lymphadenopathy:    She has no cervical adenopathy.  Neurological: She is alert and oriented to person, place, and time. Coordination normal.  Skin: Skin is warm and dry. No rash noted. No erythema.  Psychiatric: She has a normal mood and affect. Her behavior is normal. Judgment and thought content normal.    Assessment and Plan

## 2013-08-29 NOTE — Assessment & Plan Note (Signed)
We have suggested she continue to take Lasix as needed for leg edema

## 2013-09-06 ENCOUNTER — Ambulatory Visit: Payer: Medicare Other | Admitting: Cardiovascular Disease

## 2013-09-06 ENCOUNTER — Telehealth: Payer: Self-pay | Admitting: *Deleted

## 2013-09-06 NOTE — Telephone Encounter (Signed)
Spoke w/ pt.  Advised her to continue taking amiodarone 200mg  until advised otherwise by Dr. Rockey Situ. She reports that she occasionally feels that her heart is out of rhythm and is agreeable to continuing meds.  Pt to call if she has further questions or concerns.

## 2013-09-06 NOTE — Telephone Encounter (Signed)
Patient was r/s again. She wants to know if she should continue taking Amiodarone 200mg .

## 2013-09-13 ENCOUNTER — Ambulatory Visit: Payer: Medicare Other | Admitting: Cardiovascular Disease

## 2013-09-21 ENCOUNTER — Encounter: Payer: Self-pay | Admitting: Cardiovascular Disease

## 2013-09-21 ENCOUNTER — Ambulatory Visit (INDEPENDENT_AMBULATORY_CARE_PROVIDER_SITE_OTHER): Payer: Medicare Other | Admitting: Cardiovascular Disease

## 2013-09-21 VITALS — BP 122/82 | HR 77 | Ht 64.0 in | Wt 166.5 lb

## 2013-09-21 DIAGNOSIS — I10 Essential (primary) hypertension: Secondary | ICD-10-CM

## 2013-09-21 DIAGNOSIS — E785 Hyperlipidemia, unspecified: Secondary | ICD-10-CM

## 2013-09-21 DIAGNOSIS — I4891 Unspecified atrial fibrillation: Secondary | ICD-10-CM

## 2013-09-21 DIAGNOSIS — Z01812 Encounter for preprocedural laboratory examination: Secondary | ICD-10-CM

## 2013-09-21 NOTE — Assessment & Plan Note (Signed)
Blood pressure is well controlled on today's visit. No changes made to the medications. 

## 2013-09-21 NOTE — Assessment & Plan Note (Addendum)
After a long discussion of the options including medical management versus cardioversion, she prefers to proceed with cardioversion. Risk and benefits were discussed with her including risk of stroke, arrhythmia. We will schedule her for cardioversion for next week, February 5 in the morning. Suggested she stay on her current medications

## 2013-09-21 NOTE — Assessment & Plan Note (Signed)
No changes to the medications were made.

## 2013-09-21 NOTE — Patient Instructions (Addendum)
You are doing well. No medication changes were made.  We will schedule a cardioversion for atrial fibrillation  Your physician has recommended that you have a Cardioversion (DCCV). Electrical Cardioversion uses a jolt of electricity to your heart either through paddles or wired patches attached to your chest. This is a controlled, usually prescheduled, procedure. Defibrillation is done under light anesthesia in the hospital, and you usually go home the day of the procedure. This is done to get your heart back into a normal rhythm. You are not awake for the procedure. Please see the instruction sheet given to you today. Cardioversion sched for Thursday, Feb 5 @ 7:30.  Pt to arrive at [facility] @ 6:30. Pt is having labs drawn next week w/ Dr. Humphrey Rolls and would like to have preprocedure labs drawn at that time.  Please call us if you have new issues that need to be addressed before your next appt.  Your physician wants you to follow-up in: 1 month.     Marland Kitchen

## 2013-09-21 NOTE — Progress Notes (Signed)
Patient ID: Tabitha Potts, female    DOB: 01-12-1926, 80 y.o.   MRN: [id number]  HPI Comments: Tabitha Potts is a very pleasant 80 year old woman with a history of hemorrhoidal bleeding who presented to the hospital 06/09/2013 with GI bleeding felt secondary to hemorrhoids and diverticulitis, new onset atrial fibrillation with RVR. It was felt she had developed atrial fibrillation in early October 2014. Prior EKGs earlier in 2014, July, had shown normal sinus rhythm.In the hospital, She was started on Cardizem, initial digoxin but this was later held. Also started on anticoagulation. Norvasc and ACE inhibitor HCTZ combo was held  for low blood pressure. Creatinine in the hospital 1.47, BUN 26   On her last clinic visit, she was started on amiodarone in an attempt to pharmacologically cardiovert her to normal sinus rhythm. She remains in atrial fibrillation. She does feel better as heart rate has improved on amiodarone. She is interested in cardioversion. Previous GI bleeding, possible hemorrhoids  Echocardiogram in the hospital 06/09/2013 shows ejection fraction 60-65%, normal right ventricular systolic pressure, mildly dilated left atrium  Hematocrit 06/10/2013 was 35   Notes provided from outside clinic show stress test in 2009  Echocardiogram 04/13/2013 with same results as above    Prior EKG 06/20/2013 showed normal sinus rhythm EKG prior to that in the hospital in mid-October showed atrial fibrillation EKG July 2014 showed normal sinus rhythm EKG today shows atrial fibrillation with rate 77 beats per minute   Outpatient Encounter Prescriptions as of 09/21/2013  Medication Sig  . acetaminophen (TYLENOL) 325 MG tablet Take 650 mg by mouth every 6 (six) hours as needed.  . ALPRAZolam (XANAX) 0.25 MG tablet Take 0.25 mg by mouth at bedtime as needed.   Marland Kitchen amiodarone (PACERONE) 200 MG tablet Take 1 tablet (200 mg total) by mouth 2 (two) times daily.  Marland Kitchen apixaban (ELIQUIS) 5 MG TABS tablet Take 1  tablet (5 mg total) by mouth 2 (two) times daily.  Marland Kitchen diltiazem (DILACOR XR) 240 MG 24 hr capsule Take 1 capsule (240 mg total) by mouth daily.  . furosemide (LASIX) 20 MG tablet Take 20 mg by mouth daily.  . Multiple Vitamin (MULTIVITAMIN) tablet Take 1 tablet by mouth daily.  . NON FORMULARY Stool Softner 100 mg twice daily.  . rosuvastatin (CRESTOR) 10 MG tablet Take 10 mg by mouth daily.      Review of Systems  HENT: Negative.   Eyes: Negative.   Cardiovascular: Positive for palpitations.  Gastrointestinal: Negative.   Endocrine: Negative.   Musculoskeletal: Negative.   Skin: Negative.   Allergic/Immunologic: Negative.   Neurological: Negative.   Hematological: Negative.   Psychiatric/Behavioral: Negative.   All other systems reviewed and are negative.    BP 122/82  Pulse 77  Ht 5\' 4"  (1.626 m)  Wt 166 lb 8 oz (75.524 kg)  BMI 28.57 kg/m2  Physical Exam  Nursing note and vitals reviewed. Constitutional: She is oriented to person, place, and time. She appears well-developed and well-nourished.  HENT:  Head: Normocephalic.  Nose: Nose normal.  Mouth/Throat: Oropharynx is clear and moist.  Eyes: Conjunctivae are normal. Pupils are equal, round, and reactive to light.  Neck: Normal range of motion. Neck supple. No JVD present.  Cardiovascular: Normal rate, S1 normal, S2 normal, normal heart sounds and intact distal pulses.  An irregularly irregular rhythm present. Exam reveals no gallop and no friction rub.   No murmur heard. Pulmonary/Chest: Effort normal and breath sounds normal. No respiratory distress. She has no wheezes.  She has no rales. She exhibits no tenderness.  Abdominal: Soft. Bowel sounds are normal. She exhibits no distension. There is no tenderness.  Musculoskeletal: Normal range of motion. She exhibits no edema and no tenderness.  Lymphadenopathy:    She has no cervical adenopathy.  Neurological: She is alert and oriented to person, place, and time.  Coordination normal.  Skin: Skin is warm and dry. No rash noted. No erythema.  Psychiatric: She has a normal mood and affect. Her behavior is normal. Judgment and thought content normal.    Assessment and Plan

## 2013-09-28 ENCOUNTER — Ambulatory Visit: Payer: Self-pay | Admitting: Cardiovascular Disease

## 2013-09-28 DIAGNOSIS — I4891 Unspecified atrial fibrillation: Secondary | ICD-10-CM

## 2013-09-29 ENCOUNTER — Ambulatory Visit (INDEPENDENT_AMBULATORY_CARE_PROVIDER_SITE_OTHER): Payer: Medicare Other | Admitting: Cardiovascular Disease

## 2013-09-29 ENCOUNTER — Encounter: Payer: Self-pay | Admitting: Cardiovascular Disease

## 2013-09-29 ENCOUNTER — Telehealth: Payer: Self-pay

## 2013-09-29 VITALS — BP 124/62 | HR 68 | Ht 64.0 in | Wt 165.8 lb

## 2013-09-29 DIAGNOSIS — I4891 Unspecified atrial fibrillation: Secondary | ICD-10-CM

## 2013-09-29 DIAGNOSIS — R0602 Shortness of breath: Secondary | ICD-10-CM

## 2013-09-29 DIAGNOSIS — I10 Essential (primary) hypertension: Secondary | ICD-10-CM

## 2013-09-29 DIAGNOSIS — R600 Localized edema: Secondary | ICD-10-CM

## 2013-09-29 DIAGNOSIS — R609 Edema, unspecified: Secondary | ICD-10-CM

## 2013-09-29 DIAGNOSIS — E785 Hyperlipidemia, unspecified: Secondary | ICD-10-CM

## 2013-09-29 DIAGNOSIS — S8010XA Contusion of unspecified lower leg, initial encounter: Secondary | ICD-10-CM

## 2013-09-29 NOTE — Assessment & Plan Note (Signed)
We'll try to slowly decrease the Cardizem over the next several visits, start her on low-dose beta blockers for rhythm control

## 2013-09-29 NOTE — Assessment & Plan Note (Signed)
Blood pressure is well controlled on today's visit. No changes made to the medications. Suggested she monitor her blood pressure when she has pulsating had symptoms like she had last night

## 2013-09-29 NOTE — Telephone Encounter (Signed)
Santiago Glad called from [place] reporting that she had called to check her post cardioversion. Reports that pt stated that she is not feeling good, that she feels that her heart if pounding so hard that she can hear it.   Pt stated that her pulse was too weak to palpate, checked BP w/ her machine, 163/80, pulse "in the 80s".  Called pt and spoke w/ her. She reports that her son is there with her and is concerned, as she does not feel well at all. Gave pt the option of coming in now for an EKG or coming in this afternoon to see Dr. Rockey Situ. Pt prefers to see Dr. Rockey Situ and will come in for an appt this afternoon at 3:15.

## 2013-09-29 NOTE — Assessment & Plan Note (Signed)
She is maintaining normal sinus rhythm today. No changes to her medications In followup, we could potentially decrease her Cardizem, start her on low-dose beta blocker as she has mild lower extremity edema

## 2013-09-29 NOTE — Assessment & Plan Note (Signed)
Suggested that she stay on the Crestor

## 2013-09-29 NOTE — Progress Notes (Signed)
Patient ID: Montez Stryker, female    DOB: July 28, 1926, 80 y.o.   MRN: [id number]  HPI Comments: Ms. Troutman is a very pleasant 80 year old woman with a history of hemorrhoidal bleeding who presented to the hospital 06/09/2013 with GI bleeding felt secondary to hemorrhoids and diverticulitis, new onset atrial fibrillation with RVR. It was felt she had developed atrial fibrillation in early October 2014. Prior EKGs earlier in 2014, July, had shown normal sinus rhythm.In the hospital, She was started on Cardizem, digoxin but this was later held. Also started on anticoagulation. Norvasc and ACE inhibitor HCTZ combo was held  for low blood pressure. Creatinine in the hospital 1.47, BUN 26   On her last clinic visit, she was started on amiodarone in an attempt to pharmacologically cardiovert her to normal sinus rhythm. She remained in atrial fibrillation.   she underwent cardioversion on February 5 which was successful in restoring normal sinus rhythm She presents today for add-on followup for insomnia last night, a throbbing in her head. She called the advice nurse and was told to come into the office. She feels well today, but was unable to sleep well last night EKG today in the office shows she has maintained normal sinus rhythm, rate 68 beats per minute.  She's unable to check her blood pressure at home. In the office today, blood pressure is well controlled  On the way up to the office today, she hit her right lateral aspect of her right foot on her walker. There is a hematoma the size of a large quarter. It is tender and swollen  Echocardiogram in the hospital 06/09/2013 shows ejection fraction 60-65%, normal right ventricular systolic pressure, mildly dilated left atrium  Hematocrit 06/10/2013 was 35   Notes provided from outside clinic show stress test in 2009  Echocardiogram 04/13/2013 with same results as above    Outpatient Encounter Prescriptions as of 09/29/2013  Medication Sig  .  acetaminophen (TYLENOL) 325 MG tablet Take 650 mg by mouth every 6 (six) hours as needed.  . ALPRAZolam (XANAX) 0.25 MG tablet Take 0.25 mg by mouth at bedtime as needed.   Marland Kitchen amiodarone (PACERONE) 200 MG tablet Take 1 tablet (200 mg total) by mouth 2 (two) times daily.  Marland Kitchen apixaban (ELIQUIS) 5 MG TABS tablet Take 1 tablet (5 mg total) by mouth 2 (two) times daily.  Marland Kitchen diltiazem (DILACOR XR) 240 MG 24 hr capsule Take 1 capsule (240 mg total) by mouth daily.  . furosemide (LASIX) 20 MG tablet Take 20 mg by mouth daily.  . Multiple Vitamin (MULTIVITAMIN) tablet Take 1 tablet by mouth daily.  . NON FORMULARY Stool Softner 100 mg twice daily.  . rosuvastatin (CRESTOR) 10 MG tablet Take 10 mg by mouth daily.    Review of Systems  HENT: Negative.        Pulsating in her head  Eyes: Negative.   Cardiovascular: Positive for palpitations.  Gastrointestinal: Negative.   Endocrine: Negative.   Musculoskeletal: Negative.   Skin: Negative.   Allergic/Immunologic: Negative.   Neurological: Negative.   Hematological: Negative.   Psychiatric/Behavioral: Negative.   All other systems reviewed and are negative.    BP 124/62  Pulse 68  Ht 5\' 4"  (1.626 m)  Wt 165 lb 12 oz (75.184 kg)  BMI 28.44 kg/m2  Physical Exam  Nursing note and vitals reviewed. Constitutional: She is oriented to person, place, and time. She appears well-developed and well-nourished.  HENT:  Head: Normocephalic.  Nose: Nose normal.  Mouth/Throat: Oropharynx  is clear and moist.  Eyes: Conjunctivae are normal. Pupils are equal, round, and reactive to light.  Neck: Normal range of motion. Neck supple. No JVD present.  Cardiovascular: Normal rate, S1 normal, S2 normal, normal heart sounds and intact distal pulses.  An irregularly irregular rhythm present. Exam reveals no gallop and no friction rub.   No murmur heard. Pulmonary/Chest: Effort normal and breath sounds normal. No respiratory distress. She has no wheezes. She has no  rales. She exhibits no tenderness.  Abdominal: Soft. Bowel sounds are normal. She exhibits no distension. There is no tenderness.  Musculoskeletal: Normal range of motion. She exhibits no edema and no tenderness.  Lymphadenopathy:    She has no cervical adenopathy.  Neurological: She is alert and oriented to person, place, and time. Coordination normal.  Skin: Skin is warm and dry. No rash noted. No erythema.  Psychiatric: She has a normal mood and affect. Her behavior is normal. Judgment and thought content normal.    Assessment and Plan

## 2013-09-29 NOTE — Assessment & Plan Note (Signed)
Large hematoma on the right lateral aspect of her right foot. She hit her foot on the walker coming up to the office. We have recommended an Ace wrap, ice, leg elevation. Hold the anticoagulation tonight, possibly tomorrow morning. Foot is throbbing and tender.

## 2013-09-29 NOTE — Patient Instructions (Addendum)
You are doing well. No medication changes were made.  Hold the eliquis tonight If your foot is severely sore and bruise getting worse tomorrow, Hold the eliquis in the AM on Saturday  Please call us if you have new issues that need to be addressed before your next appt.  Your physician wants you to follow-up in: 2 weeks

## 2013-10-06 ENCOUNTER — Encounter: Payer: Medicare Other | Admitting: Cardiovascular Disease

## 2013-10-11 ENCOUNTER — Emergency Department: Payer: Self-pay | Admitting: Emergency Medicine

## 2013-10-18 ENCOUNTER — Telehealth: Payer: Self-pay | Admitting: *Deleted

## 2013-10-18 ENCOUNTER — Encounter: Payer: Medicare Other | Admitting: Cardiovascular Disease

## 2013-10-18 MED ORDER — DILTIAZEM HCL ER 180 MG PO CP24: 240.0000 mg | ORAL_CAPSULE | Freq: Every day | ORAL | Status: DC

## 2013-10-18 MED ORDER — AMIODARONE HCL 200 MG PO TABS: 200.0000 mg | ORAL_TABLET | Freq: Every day | ORAL | Status: DC

## 2013-10-18 NOTE — Telephone Encounter (Signed)
Uncertain what could be causing her sx We could try to cut back on amiodarone down to 200 mg daily Also could try to decrease the diltiazem to 180 mg daily

## 2013-10-18 NOTE — Telephone Encounter (Signed)
Please call patient she had a cardioversion. She is not feeling well and her lips are purple. At night feels dizzy and sob.

## 2013-10-18 NOTE — Telephone Encounter (Signed)
Spoke w/ pt.  She reports that her lips have been purple for the past 2 mornings when she has woken up. Reports that when she lies down, she gets "dizzy headed" for a few minutes until she closes her eyes. She reports that she has occasional sob, also.  Pt states that her BP monitor is broken, so she does not have BP or HR readings. States that on her last ov, Dr. Rockey Situ mentioned possibly stopping some of her meds and she wonders if this is something he would like to go ahead and do. Please advise.  Thank you.

## 2013-10-18 NOTE — Telephone Encounter (Signed)
Spoke w/ pt.  Advised her of Dr. Donivan Scull recommendations.  She verbalizes understanding and is agreeable to this.

## 2013-10-19 ENCOUNTER — Encounter: Payer: Medicare Other | Admitting: Cardiovascular Disease

## 2013-10-23 ENCOUNTER — Encounter: Payer: Medicare Other | Admitting: Cardiovascular Disease

## 2013-10-23 ENCOUNTER — Ambulatory Visit: Payer: Medicare Other | Admitting: Cardiovascular Disease

## 2013-10-27 ENCOUNTER — Ambulatory Visit (INDEPENDENT_AMBULATORY_CARE_PROVIDER_SITE_OTHER): Payer: Medicare Other | Admitting: Cardiovascular Disease

## 2013-10-27 ENCOUNTER — Encounter: Payer: Self-pay | Admitting: Cardiovascular Disease

## 2013-10-27 VITALS — BP 140/70 | HR 64 | Ht 63.5 in | Wt 169.0 lb

## 2013-10-27 DIAGNOSIS — I4891 Unspecified atrial fibrillation: Secondary | ICD-10-CM

## 2013-10-27 DIAGNOSIS — R609 Edema, unspecified: Secondary | ICD-10-CM

## 2013-10-27 DIAGNOSIS — R600 Localized edema: Secondary | ICD-10-CM

## 2013-10-27 DIAGNOSIS — E785 Hyperlipidemia, unspecified: Secondary | ICD-10-CM

## 2013-10-27 MED ORDER — FUROSEMIDE 20 MG PO TABS: 20.0000 mg | ORAL_TABLET | Freq: Two times a day (BID) | ORAL | Status: DC | PRN

## 2013-10-27 MED ORDER — DILTIAZEM HCL ER 240 MG PO CP24: 240.0000 mg | ORAL_CAPSULE | Freq: Every day | ORAL | Status: DC

## 2013-10-27 NOTE — Progress Notes (Signed)
Patient ID: Tabitha Potts, female    DOB: January 29, 1926, 80 y.o.   MRN: [id number]  HPI Comments: Ms. Tabitha Potts is a very pleasant 80 year old woman with a history of hemorrhoidal bleeding who presented to the hospital 06/09/2013 with GI bleeding felt secondary to hemorrhoids and diverticulitis, new onset atrial fibrillation with RVR. It was felt she had developed atrial fibrillation in early October 2014. Prior EKGs earlier in 2014, July, had shown normal sinus rhythm.In the hospital, She was started on Cardizem, digoxin but this was later held. Also started on anticoagulation. Norvasc and ACE inhibitor HCTZ combo was held  for low blood pressure. Creatinine in the hospital 1.47, BUN 26    she was started on amiodarone in an attempt to pharmacologically cardiovert her to normal sinus rhythm. She remained in atrial fibrillation.   she underwent cardioversion on September 28 2013 which was successful in restoring normal sinus rhythm  Followup in clinic after episode of insomnia, a throbbing in her head. She was in normal sinus rhythm On her way into the clinic, she hit her foot on her walker and suffered a large hematoma. We were sure to wrap her leg, immediate icing. Swelling got worse that night and she had severe pain that was 10 over 10. She went to the emergency room but was sent back home. Bruising/hematoma has since resolved. Also reports having significant stress when her son-in-law was visiting. He was eating everything in the house  She converted back to atrial fibrillation over the past month. She is relatively asymptomatic with no leg swelling, no shortness of breath, no general malaise. Overall feels well  Echocardiogram in the hospital 06/09/2013 shows ejection fraction 60-65%, normal right ventricular systolic pressure, mildly dilated left atrium  Hematocrit 06/10/2013 was 35   Notes provided from outside clinic show stress test in 2009  Echocardiogram 04/13/2013 with same results as above    EKG shows atrial fibrillation with rate 64 beats per minute, nonspecific ST abnormality    Outpatient Encounter Prescriptions as of 10/27/2013  Medication Sig  . acetaminophen (TYLENOL) 325 MG tablet Take 650 mg by mouth every 6 (six) hours as needed.  . ALPRAZolam (XANAX) 0.25 MG tablet Take 0.25 mg by mouth at bedtime as needed.   Marland Kitchen apixaban (ELIQUIS) 5 MG TABS tablet Take 1 tablet (5 mg total) by mouth 2 (two) times daily.  Marland Kitchen diltiazem (DILACOR XR) 240 MG 24 hr capsule Take 1 capsule (240 mg total) by mouth daily.  . furosemide (LASIX) 20 MG tablet Take 1 tablet (20 mg total) by mouth 2 (two) times daily as needed.  . Multiple Vitamin (MULTIVITAMIN) tablet Take 1 tablet by mouth daily.  . NON FORMULARY Stool Softner 100 mg twice daily.  . rosuvastatin (CRESTOR) 10 MG tablet Take 10 mg by mouth daily.  Marland Kitchen  amiodarone (PACERONE) 200 MG tablet Take 1 tablet (200 mg total) by mouth daily.    Review of Systems  Constitutional: Negative.   HENT: Negative.   Eyes: Negative.   Respiratory: Negative.   Cardiovascular: Negative.   Gastrointestinal: Negative.   Endocrine: Negative.   Musculoskeletal: Negative.   Skin: Negative.   Allergic/Immunologic: Negative.   Neurological: Negative.   Hematological: Negative.   Psychiatric/Behavioral: Negative.   All other systems reviewed and are negative.    BP 140/70  Pulse 64  Ht 5' 3.5" (1.613 m)  Wt 169 lb (76.658 kg)  BMI 29.46 kg/m2  Physical Exam  Nursing note and vitals reviewed. Constitutional: She is oriented  to person, place, and time. She appears well-developed and well-nourished.  HENT:  Head: Normocephalic.  Nose: Nose normal.  Mouth/Throat: Oropharynx is clear and moist.  Eyes: Conjunctivae are normal. Pupils are equal, round, and reactive to light.  Neck: Normal range of motion. Neck supple. No JVD present.  Cardiovascular: Normal rate, S1 normal, S2 normal, normal heart sounds and intact distal pulses.  An irregularly  irregular rhythm present. Exam reveals no gallop and no friction rub.   No murmur heard. Pulmonary/Chest: Effort normal and breath sounds normal. No respiratory distress. She has no wheezes. She has no rales. She exhibits no tenderness.  Abdominal: Soft. Bowel sounds are normal. She exhibits no distension. There is no tenderness.  Musculoskeletal: Normal range of motion. She exhibits no edema and no tenderness.  Lymphadenopathy:    She has no cervical adenopathy.  Neurological: She is alert and oriented to person, place, and time. Coordination normal.  Skin: Skin is warm and dry. No rash noted. No erythema.  Psychiatric: She has a normal mood and affect. Her behavior is normal. Judgment and thought content normal.    Assessment and Plan

## 2013-10-27 NOTE — Assessment & Plan Note (Signed)
Leg edema likely from chronic diastolic CHF. Encouraged Lasix use daily

## 2013-10-27 NOTE — Patient Instructions (Addendum)
Please hold the amiodarone Stay on cardizem 240 mg daily Stay on eliquis  Try meclizine for dizziness at night Take lasix daily, extra lasix for leg swelling  Please call us if you have new issues that need to be addressed before your next appt.  Your physician wants you to follow-up in: 6 months.  You will receive a reminder letter in the mail two months in advance. If you don't receive a letter, please call our office to schedule the follow-up appointment.

## 2013-10-27 NOTE — Assessment & Plan Note (Signed)
Encouraged her to stay on her Crestor. No recent lipids available

## 2013-10-27 NOTE — Assessment & Plan Note (Addendum)
Prior cardioversion 09/28/2013. Now back in atrial fibrillation possibly from severe pain in her right lower extremity following hematoma mid February . We have discussed the options for her atrial fibrillation. She prefers to remain in atrial fibrillation at this time. We will stop the amiodarone, continue diltiazem 240 mg daily, continue anticoagulation. She will call us if she has additional symptoms concerning for heart failure. We have recommended she take Lasix for any leg edema

## 2013-11-15 ENCOUNTER — Ambulatory Visit: Payer: Self-pay | Admitting: Internal Medicine

## 2013-12-06 ENCOUNTER — Observation Stay: Payer: Self-pay | Admitting: Internal Medicine

## 2013-12-06 ENCOUNTER — Telehealth: Payer: Self-pay

## 2013-12-06 LAB — URINALYSIS, COMPLETE
Bilirubin,UR: NEGATIVE
Glucose,UR: NEGATIVE mg/dL (ref 0–75)
Hyaline Cast: 1
Ketone: NEGATIVE
NITRITE: NEGATIVE
PROTEIN: NEGATIVE
Ph: 6 (ref 4.5–8.0)
RBC,UR: 2 /HPF (ref 0–5)
Specific Gravity: 1.006 (ref 1.003–1.030)
Squamous Epithelial: 1
WBC UR: 1 /HPF (ref 0–5)

## 2013-12-06 LAB — CBC
HCT: 40.4 % (ref 35.0–47.0)
HGB: 12.7 g/dL (ref 12.0–16.0)
MCH: 30 pg (ref 26.0–34.0)
MCHC: 31.5 g/dL — ABNORMAL LOW (ref 32.0–36.0)
MCV: 95 fL (ref 80–100)
Platelet: 212 10*3/uL (ref 150–440)
RBC: 4.24 10*6/uL (ref 3.80–5.20)
RDW: 14.7 % — ABNORMAL HIGH (ref 11.5–14.5)
WBC: 7.3 10*3/uL (ref 3.6–11.0)

## 2013-12-06 LAB — COMPREHENSIVE METABOLIC PANEL
ALK PHOS: 65 U/L
ALT: 20 U/L (ref 12–78)
AST: 29 U/L (ref 15–37)
Albumin: 3.7 g/dL (ref 3.4–5.0)
Anion Gap: 4 — ABNORMAL LOW (ref 7–16)
BUN: 28 mg/dL — AB (ref 7–18)
Bilirubin,Total: 0.3 mg/dL (ref 0.2–1.0)
Calcium, Total: 8.6 mg/dL (ref 8.5–10.1)
Chloride: 101 mmol/L (ref 98–107)
Co2: 31 mmol/L (ref 21–32)
Creatinine: 1.6 mg/dL — ABNORMAL HIGH (ref 0.60–1.30)
EGFR (African American): 33 — ABNORMAL LOW
EGFR (Non-African Amer.): 29 — ABNORMAL LOW
GLUCOSE: 102 mg/dL — AB (ref 65–99)
Osmolality: 278 (ref 275–301)
Potassium: 4.4 mmol/L (ref 3.5–5.1)
SODIUM: 136 mmol/L (ref 136–145)
Total Protein: 7.3 g/dL (ref 6.4–8.2)

## 2013-12-06 LAB — HEMOGLOBIN: HGB: 12.5 g/dL (ref 12.0–16.0)

## 2013-12-06 LAB — PROTIME-INR
INR: 1
PROTHROMBIN TIME: 13.5 s (ref 11.5–14.7)

## 2013-12-06 LAB — APTT: Activated PTT: 36.4 secs — ABNORMAL HIGH (ref 23.6–35.9)

## 2013-12-06 LAB — LIPASE, BLOOD: LIPASE: 327 U/L (ref 73–393)

## 2013-12-06 NOTE — Telephone Encounter (Signed)
Spoke w/ Tabitha Potts. She reports that she has had bleeding from her rectum today, feels like a "gush" when she goes to the bathroom. Reports that it fills the commode, more blood when she wipes, she is wearing a pad that is "full of blood" when she first puts it on. She feels weak, has been drinking gatorade, her BP cuff is broken so she does not know her BP. Tabitha Potts does not see a GI doctor and her PCP is not in the office today.  Advised her to call Dr. Laurelyn Sickle office and ask to speak to someone in there office and see about being referred to a GI doctor today.  If unable to see someone today, Tabitha Potts to call back.

## 2013-12-06 NOTE — Telephone Encounter (Signed)
Called to check on pt after phone call this am re bleeding per rectum. She has been admitted to [facility] for observation.

## 2013-12-07 LAB — CBC WITH DIFFERENTIAL/PLATELET
BASOS PCT: 0.6 %
Basophil #: 0 10*3/uL (ref 0.0–0.1)
EOS ABS: 0.4 10*3/uL (ref 0.0–0.7)
Eosinophil %: 5.9 %
HCT: 36.9 % (ref 35.0–47.0)
HGB: 12.1 g/dL (ref 12.0–16.0)
LYMPHS ABS: 1.3 10*3/uL (ref 1.0–3.6)
Lymphocyte %: 17.6 %
MCH: 31 pg (ref 26.0–34.0)
MCHC: 32.8 g/dL (ref 32.0–36.0)
MCV: 94 fL (ref 80–100)
Monocyte #: 0.8 x10 3/mm (ref 0.2–0.9)
Monocyte %: 10.7 %
Neutrophil #: 4.7 10*3/uL (ref 1.4–6.5)
Neutrophil %: 65.2 %
Platelet: 210 10*3/uL (ref 150–440)
RBC: 3.91 10*6/uL (ref 3.80–5.20)
RDW: 14.9 % — AB (ref 11.5–14.5)
WBC: 7.2 10*3/uL (ref 3.6–11.0)

## 2013-12-07 LAB — BASIC METABOLIC PANEL
ANION GAP: 4 — AB (ref 7–16)
BUN: 23 mg/dL — ABNORMAL HIGH (ref 7–18)
CO2: 35 mmol/L — AB (ref 21–32)
CREATININE: 1.53 mg/dL — AB (ref 0.60–1.30)
Calcium, Total: 8.9 mg/dL (ref 8.5–10.1)
Chloride: 99 mmol/L (ref 98–107)
EGFR (African American): 35 — ABNORMAL LOW
GFR CALC NON AF AMER: 30 — AB
Glucose: 85 mg/dL (ref 65–99)
Osmolality: 279 (ref 275–301)
Potassium: 4 mmol/L (ref 3.5–5.1)
SODIUM: 138 mmol/L (ref 136–145)

## 2013-12-08 ENCOUNTER — Telehealth: Payer: Self-pay | Admitting: *Deleted

## 2013-12-08 NOTE — Telephone Encounter (Signed)
Patients granddaughter called stating patient is in [facility] for rectal bleeding and hospitalist wants her to stop Eloquis and benecar is this ok? Please call. Also patient has a gi appt with Dr. Vira Agar on Tuesday 12/12/13

## 2013-12-11 ENCOUNTER — Telehealth: Payer: Self-pay

## 2013-12-11 NOTE — Telephone Encounter (Signed)
Spoke w/ pt.  She is unclear about her medications, as she states that she picked up the wrong list when she left from the hospital.  Reports that Dr. Rockey Situ advised her to hold amiodarone, but she was given this in the hospital, as well as discharged home on it.  She also states that her Benicar was d/c'd and she feels that she needs to be on this medication.  She states repeatedly that her meds are all messed up Advised pt to schedule appt to speak w/ Dr. Rockey Situ.  She is sched to come in 12/14/13.

## 2013-12-11 NOTE — Telephone Encounter (Signed)
Error

## 2013-12-14 ENCOUNTER — Encounter: Payer: Self-pay | Admitting: Cardiovascular Disease

## 2013-12-14 ENCOUNTER — Ambulatory Visit (INDEPENDENT_AMBULATORY_CARE_PROVIDER_SITE_OTHER): Payer: Medicare Other | Admitting: Cardiovascular Disease

## 2013-12-14 VITALS — BP 124/62 | HR 66 | Ht 64.0 in | Wt 165.0 lb

## 2013-12-14 DIAGNOSIS — R600 Localized edema: Secondary | ICD-10-CM

## 2013-12-14 DIAGNOSIS — R2681 Unsteadiness on feet: Secondary | ICD-10-CM | POA: Insufficient documentation

## 2013-12-14 DIAGNOSIS — I10 Essential (primary) hypertension: Secondary | ICD-10-CM

## 2013-12-14 DIAGNOSIS — R269 Unspecified abnormalities of gait and mobility: Secondary | ICD-10-CM

## 2013-12-14 DIAGNOSIS — E785 Hyperlipidemia, unspecified: Secondary | ICD-10-CM

## 2013-12-14 DIAGNOSIS — R609 Edema, unspecified: Secondary | ICD-10-CM

## 2013-12-14 DIAGNOSIS — I4891 Unspecified atrial fibrillation: Secondary | ICD-10-CM

## 2013-12-14 MED ORDER — APIXABAN 5 MG PO TABS: 5.0000 mg | ORAL_TABLET | Freq: Two times a day (BID) | ORAL | Status: DC

## 2013-12-14 NOTE — Assessment & Plan Note (Signed)
Minimal leg edema. Encouraged her to take Lasix as needed for swelling.

## 2013-12-14 NOTE — Patient Instructions (Signed)
You are doing well. Please retry eliquis 2.5 mg twice a day If you have GI bleeding, Stop the eliquis and call the office  Stop the aspirin  Please call us if you have new issues that need to be addressed before your next appt.  Your physician wants you to follow-up in: 6 months.  You will receive a reminder letter in the mail two months in advance. If you don't receive a letter, please call our office to schedule the follow-up appointment.

## 2013-12-14 NOTE — Assessment & Plan Note (Signed)
Blood pressure is well controlled on today's visit. No changes made to the medications. 

## 2013-12-14 NOTE — Assessment & Plan Note (Signed)
Currently uses a walker. No recent falls

## 2013-12-14 NOTE — Progress Notes (Signed)
Patient ID: Tabitha Potts, female    DOB: [birth year], 80 y.o.   MRN: [id number]  HPI Comments: Tabitha Potts is a very pleasant 80 year old woman with a history of hemorrhoidal bleeding who presented to the hospital 06/09/2013 with GI bleeding felt secondary to hemorrhoids and diverticulitis, new onset atrial fibrillation with RVR. It was felt she had developed atrial fibrillation in early October 2014. Prior EKGs earlier in 2014, July, had shown normal sinus rhythm.In the hospital, She was started on Cardizem, digoxin but this was later held. Also started on anticoagulation. Norvasc and ACE inhibitor HCTZ combo was held  for low blood pressure. Creatinine in the hospital 1.47, BUN 26    she was started on amiodarone in an attempt to pharmacologically cardiovert her to normal sinus rhythm. She remained in atrial fibrillation.   she underwent cardioversion on February 5 78 which was successful in restoring normal sinus rhythm Followup in clinic after episode of insomnia, a throbbing in her head. She was in normal sinus rhythm On her way into the clinic, she hit her foot on her walker and suffered a large hematoma.  Swelling got worse that night and she had severe pain that was 10 over 10. She went to the emergency room but was sent back home. Bruising/hematoma  resolved. Also reports having significant stress when her son-in-law was visiting. He was eating everything in the house She converted back to atrial fibrillation during these events.    While in atrial fibrillation, She is relatively asymptomatic with no leg swelling, no shortness of breath, no general malaise. Overall feels well She decided to stay on anticoagulation and not try to restore normal sinus rhythm  At the beginning of April 2015, she had a recurrent hemorrhoidal/diverticuli bleed. eliquis was held at the time of her discharge from the hospital. She saw Dr. Aleen Sells yesterday and she reports he told her she could do as she wanted with  the anticoagulation. He was not going to do any invasive procedures such as colonoscopy.  After long discussion with her, she would like to restart some of the eliquis. She was started on low-dose aspirin when she left the hospital.  She continues to have periods of constipation.  Echocardiogram in the hospital 06/09/2013 shows ejection fraction 60-65%, normal right ventricular systolic pressure, mildly dilated left atrium  Hematocrit 06/10/2013 was 35   Since stress test in 2009  Echocardiogram 04/13/2013 with same results as above   EKG shows atrial fibrillation with rate 66 beats per minute, nonspecific ST abnormality    Outpatient Encounter Prescriptions as of 12/14/2013  Medication Sig  . acetaminophen (TYLENOL) 325 MG tablet Take 650 mg by mouth every 6 (six) hours as needed.  . ALPRAZolam (XANAX) 0.25 MG tablet Take 0.25 mg by mouth at bedtime as needed.   . diltiazem (DILACOR XR) 240 MG 24 hr capsule Take 1 capsule (240 mg total) by mouth daily.  . furosemide (LASIX) 20 MG tablet Take 1 tablet (20 mg total) by mouth 2 (two) times daily as needed.  . Multiple Vitamin (MULTIVITAMIN) tablet Take 1 tablet by mouth daily.  . NON FORMULARY Stool Softner 100 mg twice daily.  . rosuvastatin (CRESTOR) 10 MG tablet Take 10 mg by mouth daily.  Marland Kitchen  aspirin 81 MG tablet Take 81 mg by mouth daily.    Review of Systems  Constitutional: Negative.   HENT: Negative.   Eyes: Negative.   Respiratory: Negative.   Cardiovascular: Negative.   Gastrointestinal: Negative.   Endocrine:  Negative.   Musculoskeletal: Negative.   Skin: Negative.   Allergic/Immunologic: Negative.   Neurological: Negative.   Hematological: Negative.   Psychiatric/Behavioral: Negative.   All other systems reviewed and are negative.   BP 124/62  Pulse 66  Ht 5\' 4"  (1.626 m)  Wt 165 lb (74.844 kg)  BMI 28.31 kg/m2  Physical Exam  Nursing note and vitals reviewed. Constitutional: She is oriented to person,  place, and time. She appears well-developed and well-nourished.  HENT:  Head: Normocephalic.  Nose: Nose normal.  Mouth/Throat: Oropharynx is clear and moist.  Eyes: Conjunctivae are normal. Pupils are equal, round, and reactive to light.  Neck: Normal range of motion. Neck supple. No JVD present.  Cardiovascular: Normal rate, S1 normal, S2 normal, normal heart sounds and intact distal pulses.  An irregularly irregular rhythm present. Exam reveals no gallop and no friction rub.   No murmur heard. Pulmonary/Chest: Effort normal and breath sounds normal. No respiratory distress. She has no wheezes. She has no rales. She exhibits no tenderness.  Abdominal: Soft. Bowel sounds are normal. She exhibits no distension. There is no tenderness.  Musculoskeletal: Normal range of motion. She exhibits no edema and no tenderness.  Lymphadenopathy:    She has no cervical adenopathy.  Neurological: She is alert and oriented to person, place, and time. Coordination normal.  Skin: Skin is warm and dry. No rash noted. No erythema.  Psychiatric: She has a normal mood and affect. Her behavior is normal. Judgment and thought content normal.    Assessment and Plan

## 2013-12-14 NOTE — Assessment & Plan Note (Signed)
Encouraged her to stay on her Crestor

## 2013-12-14 NOTE — Assessment & Plan Note (Addendum)
Heart rate is well controlled on her current medications. She has indicated that she would like to start anticoagulation. We had a long discussion with her about this today. Discussed the risk and benefits of anticoagulation . There is a stroke risk without anticoagulation, risk of bleeding with anticoagulation .We have suggested that she restart eliquis at 2.5 mg twice a day for several months before dancing to a higher dose as she was on previously. She reports that she is more afraid of stroke and she is of diverticular bleed. She has had 3 bleeds in the past, the last was October 2014 at which time she was not on anticoagulation.

## 2013-12-22 ENCOUNTER — Telehealth: Payer: Self-pay

## 2013-12-22 NOTE — Telephone Encounter (Signed)
Pt left message on voicemail stating that she was having "continuous bleeding that is worsening". Spoke w/ Dr. Rockey Situ regarding pt's message.  Spoke w/ pt and advised her of Dr. Donivan Scull recommendation to stop Eliquis and call Dr. Percell Boston office.  She reports that she is noticing blood in her urine and coming from her vagina.  Advised her to contact her PCP.  She reports that she saw Dr. Humphrey Rolls for this yesterday who performed a u/a and advised her that she has "blood in my kidneys". Reiterated to stop the Eliquis and call Dr. Percell Boston office to make him aware. She states that she did not take her Eliquis today. She will call w/ further questions or concerns.

## 2014-02-06 ENCOUNTER — Telehealth: Payer: Self-pay

## 2014-02-06 NOTE — Telephone Encounter (Signed)
Pt called and states she has been very SOB, can barely walk from one room to another please call.

## 2014-02-06 NOTE — Telephone Encounter (Signed)
Spoke w/ pt.  She reports that she has been in afib "since October", has increasing SOB and is frustrated at her worsening condition.  Pt reports that she does not weigh herself daily, does not check her pulse or BP. Reports that her friend recently had a pacemaker implanted and has no problems walking around.  She would like to discuss this w/ Dr. Rockey Situ as an option for her.  Pt sched to see Dr. Rockey Situ this week.

## 2014-02-08 ENCOUNTER — Ambulatory Visit (INDEPENDENT_AMBULATORY_CARE_PROVIDER_SITE_OTHER): Payer: Medicare Other | Admitting: Cardiovascular Disease

## 2014-02-08 ENCOUNTER — Encounter: Payer: Self-pay | Admitting: Cardiovascular Disease

## 2014-02-08 VITALS — BP 120/60 | HR 56 | Ht 63.0 in | Wt 168.2 lb

## 2014-02-08 DIAGNOSIS — R600 Localized edema: Secondary | ICD-10-CM

## 2014-02-08 DIAGNOSIS — R079 Chest pain, unspecified: Secondary | ICD-10-CM

## 2014-02-08 DIAGNOSIS — I4891 Unspecified atrial fibrillation: Secondary | ICD-10-CM

## 2014-02-08 DIAGNOSIS — R0602 Shortness of breath: Secondary | ICD-10-CM

## 2014-02-08 DIAGNOSIS — R269 Unspecified abnormalities of gait and mobility: Secondary | ICD-10-CM

## 2014-02-08 DIAGNOSIS — I10 Essential (primary) hypertension: Secondary | ICD-10-CM

## 2014-02-08 DIAGNOSIS — R609 Edema, unspecified: Secondary | ICD-10-CM

## 2014-02-08 DIAGNOSIS — E785 Hyperlipidemia, unspecified: Secondary | ICD-10-CM

## 2014-02-08 DIAGNOSIS — I482 Chronic atrial fibrillation, unspecified: Secondary | ICD-10-CM

## 2014-02-08 DIAGNOSIS — Z8719 Personal history of other diseases of the digestive system: Secondary | ICD-10-CM

## 2014-02-08 DIAGNOSIS — R2681 Unsteadiness on feet: Secondary | ICD-10-CM

## 2014-02-08 DIAGNOSIS — K922 Gastrointestinal hemorrhage, unspecified: Secondary | ICD-10-CM | POA: Insufficient documentation

## 2014-02-08 DIAGNOSIS — I5032 Chronic diastolic (congestive) heart failure: Secondary | ICD-10-CM

## 2014-02-08 DIAGNOSIS — I509 Heart failure, unspecified: Secondary | ICD-10-CM

## 2014-02-08 NOTE — Progress Notes (Addendum)
Patient ID: Tabitha Potts, female    DOB: 08/12/1926, 80 y.o.   MRN: [id number]  HPI Comments: Tabitha Potts is a very pleasant 80 year old woman with a history of hemorrhoidal bleeding who presented to the hospital 06/09/2013 with GI bleeding felt secondary to hemorrhoids and diverticulitis, new onset atrial fibrillation with RVR. It was felt she had developed atrial fibrillation in early October 2014. Prior EKGs earlier in 2014, July, had shown normal sinus rhythm.In the hospital, She was started on Cardizem, digoxin but this was later held. Also started on anticoagulation. Norvasc and ACE inhibitor HCTZ combo was held  for low blood pressure. Creatinine in the hospital 1.47, BUN 26    she was started on amiodarone in an attempt to pharmacologically cardiovert her to normal sinus rhythm. She remained in atrial fibrillation.   she underwent cardioversion on September 28 2013 which was successful in restoring normal sinus rhythm Followup in clinic after episode of insomnia, a throbbing in her head. She was in normal sinus rhythm On her way into the clinic, she hit her foot on her walker and suffered a large hematoma.  Swelling got worse that night and she had severe pain that was 10 over 10. She went to the emergency room but was sent back home. Bruising/hematoma  resolved. Also reports having significant stress when her son-in-law was visiting. He was eating everything in the house She converted back to atrial fibrillation during these events.    On her prior clinic visit, she was in atrial fibrillation and relatively asymptomatic. On today's visit, she reports having some chest fullness. She notices this with exertion.  She does have lower extremity edema. She has been taking diuretic once a day. Uncertain if her symptoms are from fluid overload. When asked about prior ischemia workup, she reports that she had a remote history of nuclear stress testing but cannot tolerate the Myoview study. She has any  allergy to dye and prefers not to have cardiac catheterization or CT scan.  At the beginning of April 2015, she had a recurrent hemorrhoidal/diverticuli bleed. eliquis was held at the time of her discharge from the hospital. She saw Dr. Aleen Sells yesterday and she reports he told her she could do as she wanted with the anticoagulation. He was not going to do any invasive procedures such as colonoscopy.   Echocardiogram in the hospital 06/09/2013 shows ejection fraction 60-65%, normal right ventricular systolic pressure, mildly dilated left atrium  Hematocrit 06/10/2013 was 35   Since stress test in 2009  Echocardiogram 04/13/2013 with same results as above      Outpatient Encounter Prescriptions as of 02/08/2014  Medication Sig  . acetaminophen (TYLENOL) 325 MG tablet Take 650 mg by mouth every 6 (six) hours as needed.  . ALPRAZolam (XANAX) 0.25 MG tablet Take 0.25 mg by mouth at bedtime as needed.   . diltiazem (DILACOR XR) 240 MG 24 hr capsule Take 1 capsule (240 mg total) by mouth daily.  . furosemide (LASIX) 20 MG tablet Take 1 tablet (20 mg total) by mouth 2 (two) times daily as needed.  . Multiple Vitamin (MULTIVITAMIN) tablet Take 1 tablet by mouth daily.  . NON FORMULARY Stool Softner 100 mg twice daily.  Marland Kitchen olmesartan-hydrochlorothiazide (BENICAR HCT) 40-25 MG per tablet Take 1 tablet by mouth daily.  . rosuvastatin (CRESTOR) 10 MG tablet Take 10 mg by mouth daily.  . [DISCONTINUED] apixaban (ELIQUIS) 5 MG TABS tablet Take 1 tablet (5 mg total) by mouth 2 (two) times daily.  Review of Systems  Constitutional: Negative.   HENT: Negative.   Eyes: Negative.   Respiratory: Positive for chest tightness.   Cardiovascular: Negative.   Gastrointestinal: Negative.   Endocrine: Negative.   Musculoskeletal: Negative.   Skin: Negative.   Allergic/Immunologic: Negative.   Neurological: Negative.   Hematological: Negative.   Psychiatric/Behavioral: Negative.   All other systems  reviewed and are negative.   BP 120/60  Pulse 56  Ht 5\' 3"  (1.6 m)  Wt 168 lb 4 oz (76.318 kg)  BMI 29.81 kg/m2  Physical Exam  Nursing note and vitals reviewed. Constitutional: She is oriented to person, place, and time. She appears well-developed and well-nourished.  HENT:  Head: Normocephalic.  Nose: Nose normal.  Mouth/Throat: Oropharynx is clear and moist.  Eyes: Conjunctivae are normal. Pupils are equal, round, and reactive to light.  Neck: Normal range of motion. Neck supple. No JVD present.  Cardiovascular: Normal rate, S1 normal, S2 normal, normal heart sounds and intact distal pulses.  An irregularly irregular rhythm present. Exam reveals no gallop and no friction rub.   No murmur heard. Pulmonary/Chest: Effort normal and breath sounds normal. No respiratory distress. She has no wheezes. She has no rales. She exhibits no tenderness.  Abdominal: Soft. Bowel sounds are normal. She exhibits no distension. There is no tenderness.  Musculoskeletal: Normal range of motion. She exhibits no edema and no tenderness.  Lymphadenopathy:    She has no cervical adenopathy.  Neurological: She is alert and oriented to person, place, and time. Coordination normal.  Skin: Skin is warm and dry. No rash noted. No erythema.  Psychiatric: She has a normal mood and affect. Her behavior is normal. Judgment and thought content normal.    Assessment and Plan

## 2014-02-08 NOTE — Patient Instructions (Signed)
You are doing well. No medication changes were made.  Please call the office if you have worsening chest pain and shortness of breath  Please call us if you have new issues that need to be addressed before your next appt.  Your physician wants you to follow-up in: 3 month.  You will receive a reminder letter in the mail two months in advance. If you don't receive a letter, please call our office to schedule the follow-up appointment.

## 2014-02-08 NOTE — Progress Notes (Signed)
[place], [place] [address]  Date:  02/08/2014   Patient ID:  Tabitha Potts, DOB 1925/11/02, MRN [id number]   PCP:  Perrin Maltese, MD  Cardiologist:  Dr. Esmond Plants, MD       Patient Profile: 80 y.o. female with history of a fib currently on aspirin, GI bleed, hemorrhoids, diverticulitis, hypertension, hyperlipidemia, lower extremity edema, unstable gait, and skin cancer who presents with SOB since October 2014 and chest heaviness for 3-4 weeks.   History of Present Illness: Tabitha Potts is a 80 y.o. female with the above complex problem list presents for evaluation of SOB since October 2014 and chest heaviness for the past 3-4 weeks. During this time she developed another GI bleed which she states is thought to be from a hemorrhoid. Her Eliquis was stopped on 12/22/13 secondary to GI bleed. She is currently trying to get in with Dr. Tiffany Kocher, her GI doctor.  Her SOB and chest heaviness are both related to activity. She cannot "sweep my floor" without getting short of breath. She will have to stop half way through sweeping to take a break. Her SOB will last for about an hour with resting. Weights have remained stable. She notes long standing lower extremity edema that has not worsened. Notes a chest heaviness and associated palpitations with activity as well that lasts for 3-4 minutes with rest. No associated nausea, vomiting, diaphoresis, dizziness, presyncope, or syncope.  She states she had a really bad experience with a prior stress test and would not go through with another one. She is allergic to contrast dye.     Recent Labs: No results found for requested labs within last 365 days.  Wt Readings from Last 3 Encounters:  02/08/14 168 lb 4 oz (76.318 kg)  12/14/13 165 lb (74.844 kg)  10/27/13 169 lb (76.658 kg)     Past Medical History  Diagnosis Date  . Hypertension   . Hyperlipidemia   . Diverticulosis   . Hematochezia   . A-fib   . Hemorrhoids   .  Osteoarthritis   . Cancer     history nose    Current Outpatient Prescriptions  Medication Sig Dispense Refill  . acetaminophen (TYLENOL) 325 MG tablet Take 650 mg by mouth every 6 (six) hours as needed.      . ALPRAZolam (XANAX) 0.25 MG tablet Take 0.25 mg by mouth at bedtime as needed.       . diltiazem (DILACOR XR) 240 MG 24 hr capsule Take 1 capsule (240 mg total) by mouth daily.  30 capsule  11  . furosemide (LASIX) 20 MG tablet Take 1 tablet (20 mg total) by mouth 2 (two) times daily as needed.  60 tablet  11  . Multiple Vitamin (MULTIVITAMIN) tablet Take 1 tablet by mouth daily.      . NON FORMULARY Stool Softner 100 mg twice daily.      Marland Kitchen olmesartan-hydrochlorothiazide (BENICAR HCT) 40-25 MG per tablet Take 1 tablet by mouth daily.      . rosuvastatin (CRESTOR) 10 MG tablet Take 10 mg by mouth daily.       No current facility-administered medications for this visit.    Allergies:   Celebrex; Codeine; and Ivp dye   Social History:  The patient  reports that she has never smoked. She does not have any smokeless tobacco history on file. She reports that she does not drink alcohol or use illicit drugs.   Family History:  The patient's family  history includes Heart disease in her son; Hypertension in her son.   ROS:  Please see the history of present illness. All other systems reviewed and negative.   PHYSICAL EXAM:  VS:  BP 120/60  Pulse 56  Ht 5\' 3"  (1.6 m)  Wt 168 lb 4 oz (76.318 kg)  BMI 29.81 kg/m2 Well nourished, well developed, in no acute distress HEENT: normal Neck: no JVD Cardiac:  irregularly irregular, S1, S2; RRR; no murmur Lungs:  minimal bilateral crackles. No wheezing, rhonchi or rales Abd: soft, nontender, no hepatomegaly Ext: 1+ bilateral nonpitting edema up to the mid tibia  Skin: warm and dry Neuro:  moves all extremities spontaneously, no focal abnormalities noted  EKG:  A fib, 56, nonspecific ST-T waves changes  ASSESSMENT AND PLAN:  1. A fib:  Rate controlled on current medications. Restart Eliquis 5 mg at 1/2 tab bid. Bleeding risks were discussed with her. Should she develop GI bleed she may need to stop this.   2. SOB: Currently taking Lasix 20 mg daily. She was started on Benicar HCT 40/25 mg daily on 6/9 by her PCP. She was advised the HCTZ will help take some fluid off, but will not be quite as strong as potentially adding in another Lasix dosage a few days per week. At this time she will see how she does with the HCTZ, but may need to add in another Lasix. Will have to look into medication reconciliation at that time. Labs were recently drawn by PCP.    3. Lower extremity edema: Currently on Lasix. She was recently started on Benicar HCT 40/25 mg daily. She may need to go on further Lasix. Further medication reconciliation pending.    4. Chest heaviness: She is hesitant with proceeding with cardiac stress testing as well as allergic to contrast dye making further evaluation of this difficult. She was advised to call or return to clinic if her symptoms worsen.    5. History of GI bleed: Advised patient to contact Dr. Edd Fabian office again. She should not require a referral at this time as she is an established patient. See above for   6. Hypertension: Well controlled today. No changes.   7. Hyperlipidemia: Continue Crestor.   8. Unsteady gait: Uses walker. No recent falls.   9. Dispo: Follow up scheduled with Dr. Rockey Situ in September 2015, sooner if needed.      Signed,  Christell Faith, MHS, PA-C [place], [place] [address] [phone]

## 2014-02-09 DIAGNOSIS — I5032 Chronic diastolic (congestive) heart failure: Secondary | ICD-10-CM | POA: Insufficient documentation

## 2014-02-09 NOTE — Assessment & Plan Note (Signed)
Blood pressure is well controlled on today's visit. No changes made to the medications. 

## 2014-02-09 NOTE — Assessment & Plan Note (Signed)
Heart rate relatively well controlled on today's visit. No medication changes made.

## 2014-02-09 NOTE — Assessment & Plan Note (Signed)
Leg edema likely from exacerbation of her diastolic CHF. Recommended she take Lasix twice a day until edema has improved

## 2014-02-09 NOTE — Assessment & Plan Note (Signed)
Recommended she stay on her Crestor

## 2014-02-09 NOTE — Assessment & Plan Note (Signed)
We have recommended she take extra Lasix in the afternoon for the next several days. She does have mild leg edema. Chest fullness could be secondary to acute on chronic diastolic CHF.

## 2014-02-17 LAB — CBC WITH DIFFERENTIAL/PLATELET
BASOS ABS: 0.1 10*3/uL (ref 0.0–0.1)
BASOS PCT: 0.6 %
Eosinophil #: 0.2 10*3/uL (ref 0.0–0.7)
Eosinophil %: 2.6 %
HCT: 41.6 % (ref 35.0–47.0)
HGB: 13.6 g/dL (ref 12.0–16.0)
LYMPHS PCT: 12.7 %
Lymphocyte #: 1.1 10*3/uL (ref 1.0–3.6)
MCH: 31 pg (ref 26.0–34.0)
MCHC: 32.8 g/dL (ref 32.0–36.0)
MCV: 95 fL (ref 80–100)
Monocyte #: 0.7 x10 3/mm (ref 0.2–0.9)
Monocyte %: 7.8 %
Neutrophil #: 6.9 10*3/uL — ABNORMAL HIGH (ref 1.4–6.5)
Neutrophil %: 76.3 %
Platelet: 243 10*3/uL (ref 150–440)
RBC: 4.4 10*6/uL (ref 3.80–5.20)
RDW: 14 % (ref 11.5–14.5)
WBC: 9 10*3/uL (ref 3.6–11.0)

## 2014-02-17 LAB — URINALYSIS, COMPLETE
Bilirubin,UR: NEGATIVE
GLUCOSE, UR: NEGATIVE mg/dL (ref 0–75)
Ketone: NEGATIVE
Nitrite: NEGATIVE
PROTEIN: NEGATIVE
Ph: 6 (ref 4.5–8.0)
SPECIFIC GRAVITY: 1.008 (ref 1.003–1.030)
WBC UR: 1 /HPF (ref 0–5)

## 2014-02-17 LAB — COMPREHENSIVE METABOLIC PANEL
ALBUMIN: 4 g/dL (ref 3.4–5.0)
ALK PHOS: 66 U/L
ALT: 21 U/L (ref 12–78)
AST: 18 U/L (ref 15–37)
Anion Gap: 7 (ref 7–16)
BILIRUBIN TOTAL: 0.3 mg/dL (ref 0.2–1.0)
BUN: 37 mg/dL — ABNORMAL HIGH (ref 7–18)
CHLORIDE: 88 mmol/L — AB (ref 98–107)
CO2: 34 mmol/L — AB (ref 21–32)
Calcium, Total: 9.6 mg/dL (ref 8.5–10.1)
Creatinine: 1.94 mg/dL — ABNORMAL HIGH (ref 0.60–1.30)
EGFR (African American): 27 — ABNORMAL LOW
GFR CALC NON AF AMER: 23 — AB
GLUCOSE: 103 mg/dL — AB (ref 65–99)
Osmolality: 268 (ref 275–301)
POTASSIUM: 3.8 mmol/L (ref 3.5–5.1)
Sodium: 129 mmol/L — ABNORMAL LOW (ref 136–145)
TOTAL PROTEIN: 7.5 g/dL (ref 6.4–8.2)

## 2014-02-17 LAB — APTT: Activated PTT: 35.1 secs (ref 23.6–35.9)

## 2014-02-17 LAB — PROTIME-INR
INR: 1.1
Prothrombin Time: 13.6 secs (ref 11.5–14.7)

## 2014-02-17 LAB — HEMOGLOBIN: HGB: 13.2 g/dL (ref 12.0–16.0)

## 2014-02-18 ENCOUNTER — Inpatient Hospital Stay: Payer: Self-pay | Admitting: Internal Medicine

## 2014-02-18 DIAGNOSIS — I4891 Unspecified atrial fibrillation: Secondary | ICD-10-CM

## 2014-02-18 DIAGNOSIS — K922 Gastrointestinal hemorrhage, unspecified: Secondary | ICD-10-CM

## 2014-02-18 DIAGNOSIS — N183 Chronic kidney disease, stage 3 unspecified: Secondary | ICD-10-CM

## 2014-02-18 DIAGNOSIS — E785 Hyperlipidemia, unspecified: Secondary | ICD-10-CM

## 2014-02-18 DIAGNOSIS — I10 Essential (primary) hypertension: Secondary | ICD-10-CM

## 2014-02-18 LAB — HEMOGLOBIN
HGB: 12.4 g/dL (ref 12.0–16.0)
HGB: 12.4 g/dL (ref 12.0–16.0)

## 2014-02-19 DIAGNOSIS — I4891 Unspecified atrial fibrillation: Secondary | ICD-10-CM

## 2014-02-19 LAB — BASIC METABOLIC PANEL
Anion Gap: 8 (ref 7–16)
BUN: 20 mg/dL — ABNORMAL HIGH (ref 7–18)
CHLORIDE: 93 mmol/L — AB (ref 98–107)
Calcium, Total: 8.4 mg/dL — ABNORMAL LOW (ref 8.5–10.1)
Co2: 29 mmol/L (ref 21–32)
Creatinine: 1.54 mg/dL — ABNORMAL HIGH (ref 0.60–1.30)
EGFR (African American): 35 — ABNORMAL LOW
GFR CALC NON AF AMER: 30 — AB
Glucose: 70 mg/dL (ref 65–99)
OSMOLALITY: 262 (ref 275–301)
Potassium: 3.4 mmol/L — ABNORMAL LOW (ref 3.5–5.1)
Sodium: 130 mmol/L — ABNORMAL LOW (ref 136–145)

## 2014-02-19 LAB — CBC WITH DIFFERENTIAL/PLATELET
Basophil #: 0 10*3/uL (ref 0.0–0.1)
Basophil %: 0.5 %
Eosinophil #: 0.3 10*3/uL (ref 0.0–0.7)
Eosinophil %: 3.8 %
HCT: 36 % (ref 35.0–47.0)
HGB: 12 g/dL (ref 12.0–16.0)
LYMPHS ABS: 1 10*3/uL (ref 1.0–3.6)
Lymphocyte %: 11.4 %
MCH: 31.1 pg (ref 26.0–34.0)
MCHC: 33.2 g/dL (ref 32.0–36.0)
MCV: 94 fL (ref 80–100)
MONO ABS: 0.8 x10 3/mm (ref 0.2–0.9)
MONOS PCT: 9.5 %
Neutrophil #: 6.6 10*3/uL — ABNORMAL HIGH (ref 1.4–6.5)
Neutrophil %: 74.8 %
Platelet: 213 10*3/uL (ref 150–440)
RBC: 3.85 10*6/uL (ref 3.80–5.20)
RDW: 13.8 % (ref 11.5–14.5)
WBC: 8.8 10*3/uL (ref 3.6–11.0)

## 2014-02-19 LAB — MAGNESIUM: MAGNESIUM: 1.5 mg/dL — AB

## 2014-02-22 ENCOUNTER — Telehealth: Payer: Self-pay

## 2014-02-22 MED ORDER — ASPIRIN EC 81 MG PO TBEC: 81.0000 mg | DELAYED_RELEASE_TABLET | Freq: Every day | ORAL | Status: AC

## 2014-02-22 NOTE — Telephone Encounter (Signed)
Patient contacted regarding discharge from [facility] on 02/21/14.  Patient understands to follow up with Ignacia Bayley, NP on 03/02/14 at 2:15 at [facility]. Patient understands discharge instructions? yes Patient understands medications and regiment? yes Patient understands to bring all medications to this visit? yes  Pt reports pain in her lower stomach since her colonoscopy, GI sent meds for GERD, but pt has had little relief.  She will contact PCP regarding this.  Pt's Eliquis was stopped at discharge, started on baby asa today.

## 2014-02-23 LAB — PATHOLOGY REPORT

## 2014-02-28 ENCOUNTER — Encounter: Payer: Self-pay | Admitting: *Deleted

## 2014-03-02 ENCOUNTER — Encounter: Payer: Medicare Other | Admitting: Nurse Practitioner

## 2014-03-07 ENCOUNTER — Encounter: Payer: Self-pay | Admitting: Cardiovascular Disease

## 2014-03-07 ENCOUNTER — Ambulatory Visit (INDEPENDENT_AMBULATORY_CARE_PROVIDER_SITE_OTHER): Payer: Medicare Other | Admitting: Cardiovascular Disease

## 2014-03-07 VITALS — BP 120/62 | HR 66 | Ht 63.0 in | Wt 165.5 lb

## 2014-03-07 DIAGNOSIS — I5032 Chronic diastolic (congestive) heart failure: Secondary | ICD-10-CM

## 2014-03-07 DIAGNOSIS — E785 Hyperlipidemia, unspecified: Secondary | ICD-10-CM

## 2014-03-07 DIAGNOSIS — I4891 Unspecified atrial fibrillation: Secondary | ICD-10-CM

## 2014-03-07 DIAGNOSIS — K5731 Diverticulosis of large intestine without perforation or abscess with bleeding: Secondary | ICD-10-CM

## 2014-03-07 DIAGNOSIS — K5791 Diverticulosis of intestine, part unspecified, without perforation or abscess with bleeding: Secondary | ICD-10-CM

## 2014-03-07 DIAGNOSIS — I509 Heart failure, unspecified: Secondary | ICD-10-CM

## 2014-03-07 DIAGNOSIS — R600 Localized edema: Secondary | ICD-10-CM

## 2014-03-07 DIAGNOSIS — R609 Edema, unspecified: Secondary | ICD-10-CM

## 2014-03-07 NOTE — Patient Instructions (Signed)
You are doing well. No medication changes were made.  Please let us know what happens after meeting the surgeon  Please call us if you have new issues that need to be addressed before your next appt.  Your physician wants you to follow-up in: 3 months.

## 2014-03-07 NOTE — Assessment & Plan Note (Signed)
Permanent atrial fibrillation. Unable to tolerate anticoagulation at this time secondary to hemorrhoidal or diverticuli bleeding

## 2014-03-07 NOTE — Progress Notes (Signed)
Patient ID: Tabitha Potts, female    DOB: 1925-10-30, 80 y.o.   MRN: [id number]  HPI Comments: Tabitha Potts is a very pleasant 80 year old woman with a history of hemorrhoidal bleeding who presented to the hospital 06/09/2013 with GI bleeding felt secondary to hemorrhoids and diverticulitis, new onset atrial fibrillation with RVR. It was felt she had developed atrial fibrillation in early October 2014. Prior EKGs earlier in 2014, July, had shown normal sinus rhythm.In the hospital, She was started on Cardizem, digoxin but this was later held. Also started on anticoagulation. Norvasc and ACE inhibitor HCTZ combo was held  for low blood pressure. Creatinine in the hospital 1.47, BUN 26    she was started on amiodarone in an attempt to pharmacologically cardiovert her to normal sinus rhythm. She remained in atrial fibrillation.   she underwent cardioversion on September 28 2013 which was successful in restoring normal sinus rhythm Followup in clinic after episode of insomnia, a throbbing in her head. She was in normal sinus rhythm On her way into the clinic, she hit her foot on her walker and suffered a large hematoma.  Swelling got worse that night and she had severe pain that was 10 over 10. She went to the emergency room but was sent back home. Bruising/hematoma  resolved. Also reports having significant stress when her son-in-law was visiting. He was eating everything in the house She converted back to atrial fibrillation during these events.    April 2015, she had a recurrent hemorrhoidal/diverticuli bleed. eliquis was held at the time of her discharge from the hospital. She saw Dr. Aleen Sells   She elected to restart her eliquis 2.5 mg twice a day. She did take an occasional 5 mg  On this regimen, she had recurrent bleed . She was admitted to the hospital 02/17/2014. No significant change in her hematocrit.  Colonoscopy was performed . Uncertain if bleeding was from internal hemorrhoids or diverticuli  .  She presents today and reports having continued trace bleeding which she feels is from her internal hemorrhoids. She is scheduled to see surgery for possible banding. She's not taking anything except for low-dose aspirin.  Echocardiogram in the hospital 06/09/2013 shows ejection fraction 60-65%, normal right ventricular systolic pressure, mildly dilated left atrium  Hematocrit 06/10/2013 was 35   Since stress test in 2009  Echocardiogram 04/13/2013 with same results as above   EKG shows atrial fibrillation with rate 66 beats per minute, nonspecific ST abnormality    Outpatient Encounter Prescriptions as of 03/07/2014  Medication Sig  . acetaminophen (TYLENOL) 325 MG tablet Take 650 mg by mouth every 6 (six) hours as needed.  . ALPRAZolam (XANAX) 0.25 MG tablet Take 0.25 mg by mouth at bedtime as needed.   Marland Kitchen aspirin EC 81 MG tablet Take 1 tablet (81 mg total) by mouth daily.  Marland Kitchen diltiazem (DILACOR XR) 240 MG 24 hr capsule Take 1 capsule (240 mg total) by mouth daily.  . furosemide (LASIX) 20 MG tablet Take 1 tablet (20 mg total) by mouth 2 (two) times daily as needed.  . meclizine (ANTIVERT) 25 MG tablet Take 25 mg by mouth 3 (three) times daily as needed.   . Multiple Vitamin (MULTIVITAMIN) tablet Take 1 tablet by mouth daily.  . NON FORMULARY Stool Softner 100 mg twice daily.  Marland Kitchen olmesartan-hydrochlorothiazide (BENICAR HCT) 40-25 MG per tablet Take 1 tablet by mouth daily.  Marland Kitchen PREMARIN vaginal cream Place 1 Applicatorful vaginally as needed.   . rosuvastatin (CRESTOR) 10 MG  tablet Take 10 mg by mouth daily.    Review of Systems  Constitutional: Negative.   HENT: Negative.   Eyes: Negative.   Respiratory: Negative.   Cardiovascular: Negative.   Gastrointestinal: Negative.   Endocrine: Negative.   Musculoskeletal: Negative.   Skin: Negative.   Allergic/Immunologic: Negative.   Neurological: Negative.   Hematological: Negative.   Psychiatric/Behavioral: Negative.   All other  systems reviewed and are negative.   BP 120/62  Pulse 66  Ht 5\' 3"  (1.6 m)  Wt 165 lb 8 oz (75.07 kg)  BMI 29.32 kg/m2  Physical Exam  Nursing note and vitals reviewed. Constitutional: She is oriented to person, place, and time. She appears well-developed and well-nourished.  HENT:  Head: Normocephalic.  Nose: Nose normal.  Mouth/Throat: Oropharynx is clear and moist.  Eyes: Conjunctivae are normal. Pupils are equal, round, and reactive to light.  Neck: Normal range of motion. Neck supple. No JVD present.  Cardiovascular: Normal rate, S1 normal, S2 normal, normal heart sounds and intact distal pulses.  An irregularly irregular rhythm present. Exam reveals no gallop and no friction rub.   No murmur heard. Pulmonary/Chest: Effort normal and breath sounds normal. No respiratory distress. She has no wheezes. She has no rales. She exhibits no tenderness.  Abdominal: Soft. Bowel sounds are normal. She exhibits no distension. There is no tenderness.  Musculoskeletal: Normal range of motion. She exhibits no edema and no tenderness.  Lymphadenopathy:    She has no cervical adenopathy.  Neurological: She is alert and oriented to person, place, and time. Coordination normal.  Skin: Skin is warm and dry. No rash noted. No erythema.  Psychiatric: She has a normal mood and affect. Her behavior is normal. Judgment and thought content normal.    Assessment and Plan

## 2014-03-07 NOTE — Assessment & Plan Note (Signed)
Encouraged her to stay on her Crestor

## 2014-03-07 NOTE — Assessment & Plan Note (Signed)
She takes low-dose Lasix as needed for leg edema. We have suggested she take extra Lasix for ankle swelling

## 2014-03-07 NOTE — Assessment & Plan Note (Signed)
Leg edema from chronic diastolic CHF, exacerbated by atrial fibrillation. Encourage Lasix use daily

## 2014-03-07 NOTE — Assessment & Plan Note (Signed)
Secondary to hemorrhoids and diverticulitis. She is scheduled to see surgeon for possible banding

## 2014-03-08 ENCOUNTER — Encounter: Payer: Medicare Other | Admitting: Cardiovascular Disease

## 2014-03-21 ENCOUNTER — Ambulatory Visit (INDEPENDENT_AMBULATORY_CARE_PROVIDER_SITE_OTHER): Payer: Medicare Other | Admitting: General Surgery

## 2014-03-21 ENCOUNTER — Encounter: Payer: Self-pay | Admitting: General Surgery

## 2014-03-21 VITALS — BP 124/74 | Ht 63.0 in | Wt 169.0 lb

## 2014-03-21 DIAGNOSIS — K648 Other hemorrhoids: Secondary | ICD-10-CM

## 2014-03-21 DIAGNOSIS — K625 Hemorrhage of anus and rectum: Secondary | ICD-10-CM

## 2014-03-21 MED ORDER — HYDROCORTISONE ACE-PRAMOXINE 2.5-1 % RE CREA: 1.0000 "application " | TOPICAL_CREAM | Freq: Two times a day (BID) | RECTAL | Status: DC

## 2014-03-21 NOTE — Progress Notes (Signed)
Patient ID: Tabitha Potts, female   DOB: [birth year], 80 y.o.   MRN: [id number]  Chief Complaint  Patient presents with  . Other    hemorrhoids    HPI Tabitha Potts is a 80 y.o. female here today for a evaluation of hemorrhoids. Patient states they have been there for 2 year. Patient states there is blood in the bowl and on the toilet paper. She states it is bright red blood. Patient states she has been using cream.Patient went to the ER one month ago with increased trouble breathing and was admitted for 3 days. She had a colonoscopy done by Dr. Vira Agar at that time-one polyp removed. She had been on Eliquis up to then and this was stopped. HPI  Past Medical History  Diagnosis Date  . Hypertension   . Hyperlipidemia   . Diverticulosis   . Hematochezia   . A-fib   . Hemorrhoids   . Osteoarthritis   . Cancer     history nose  . GERD (gastroesophageal reflux disease)     Past Surgical History  Procedure Laterality Date  . Knee surgery    . Back surgery    . Neck surgery    . Foot surgery    . Total hip arthroplasty      right  . Cardioversion  2015  . Colonoscopy  2015    Family History  Problem Relation Age of Onset  . Heart disease Son   . Hypertension Son     Social History History  Substance Use Topics  . Smoking status: Never Smoker   . Smokeless tobacco: Never Used  . Alcohol Use: No    Allergies  Allergen Reactions  . Celebrex [Celecoxib]   . Codeine   . Ivp Dye [Iodinated Diagnostic Agents]     Current Outpatient Prescriptions  Medication Sig Dispense Refill  . acetaminophen (TYLENOL) 325 MG tablet Take 650 mg by mouth every 6 (six) hours as needed.      . ALPRAZolam (XANAX) 0.25 MG tablet Take 0.25 mg by mouth at bedtime as needed.       Marland Kitchen aspirin EC 81 MG tablet Take 1 tablet (81 mg total) by mouth daily.  90 tablet  3  . diltiazem (DILACOR XR) 240 MG 24 hr capsule Take 1 capsule (240 mg total) by mouth daily.  30 capsule  11  . furosemide (LASIX)  20 MG tablet Take 1 tablet (20 mg total) by mouth 2 (two) times daily as needed.  60 tablet  11  . meclizine (ANTIVERT) 25 MG tablet Take 25 mg by mouth 3 (three) times daily as needed.       . Multiple Vitamin (MULTIVITAMIN) tablet Take 1 tablet by mouth daily.      . NON FORMULARY Stool Softner 100 mg twice daily.      Marland Kitchen olmesartan-hydrochlorothiazide (BENICAR HCT) 40-25 MG per tablet Take 1 tablet by mouth daily.      Marland Kitchen PREMARIN vaginal cream Place 1 Applicatorful vaginally as needed.       . rosuvastatin (CRESTOR) 10 MG tablet Take 10 mg by mouth daily.      . hydrocortisone-pramoxine (ANALPRAM-HC) 2.5-1 % rectal cream Place 1 application rectally 2 (two) times daily.  30 g  2   No current facility-administered medications for this visit.    Review of Systems Review of Systems  Constitutional: Negative.   Respiratory: Negative.   Cardiovascular: Negative.   Gastrointestinal: Positive for anal bleeding and rectal pain. Negative  for nausea, vomiting, abdominal pain, diarrhea, constipation, blood in stool and abdominal distention.    Blood pressure 124/74, height 5\' 3"  (1.6 m), weight 169 lb (76.658 kg).  Physical Exam Physical Exam  Constitutional: She is oriented to person, place, and time. She appears well-developed and well-nourished.  Eyes: Conjunctivae are normal.  Neck: Neck supple.  Cardiovascular: Normal rate and regular rhythm.   Pulmonary/Chest: Effort normal and breath sounds normal.  Abdominal: Soft. Normal appearance and bowel sounds are normal.  Genitourinary: Rectal exam shows internal hemorrhoid ( multiple internal hemorrhoids. No active bleeding ).  Neurological: She is alert and oriented to person, place, and time.  Skin: Skin is warm and dry.    Data Reviewed [facility]ed  [facility]     Multiple internal hemorrhoids.Rx given for Analpram 2.5% cream to use  twice daily prn. Pt is not anticoagulated now.       Plan   Patient to return as needed. If  bleeding continues will reevaluate for banding or surgical removal. Discussed fully with pt and she is agreeable.        SANKAR,SEEPLAPUTHUR G 03/21/2014, 12:47 PM

## 2014-03-21 NOTE — Patient Instructions (Addendum)
Patient to return as needed. Patient to use the cream twice daily . Patient to return if she is having any problems of continued bleeding.

## 2014-04-01 ENCOUNTER — Emergency Department: Payer: Self-pay | Admitting: Emergency Medicine

## 2014-04-01 LAB — CBC WITH DIFFERENTIAL/PLATELET
Basophil #: 0.1 10*3/uL (ref 0.0–0.1)
Basophil %: 0.9 %
EOS ABS: 0.5 10*3/uL (ref 0.0–0.7)
Eosinophil %: 5.5 %
HCT: 42.4 % (ref 35.0–47.0)
HGB: 13.5 g/dL (ref 12.0–16.0)
LYMPHS PCT: 14.1 %
Lymphocyte #: 1.2 10*3/uL (ref 1.0–3.6)
MCH: 30.4 pg (ref 26.0–34.0)
MCHC: 31.8 g/dL — ABNORMAL LOW (ref 32.0–36.0)
MCV: 96 fL (ref 80–100)
MONO ABS: 0.7 x10 3/mm (ref 0.2–0.9)
Monocyte %: 8.6 %
NEUTROS ABS: 5.9 10*3/uL (ref 1.4–6.5)
Neutrophil %: 70.9 %
PLATELETS: 278 10*3/uL (ref 150–440)
RBC: 4.44 10*6/uL (ref 3.80–5.20)
RDW: 14.4 % (ref 11.5–14.5)
WBC: 8.4 10*3/uL (ref 3.6–11.0)

## 2014-04-01 LAB — URINALYSIS, COMPLETE
Bilirubin,UR: NEGATIVE
Blood: NEGATIVE
Glucose,UR: NEGATIVE mg/dL (ref 0–75)
KETONE: NEGATIVE
LEUKOCYTE ESTERASE: NEGATIVE
Nitrite: NEGATIVE
Ph: 5 (ref 4.5–8.0)
Protein: NEGATIVE
SPECIFIC GRAVITY: 1.004 (ref 1.003–1.030)
Squamous Epithelial: <1
WBC UR: 1 /HPF (ref 0–5)

## 2014-04-01 LAB — BASIC METABOLIC PANEL
ANION GAP: 9 (ref 7–16)
BUN: 18 mg/dL (ref 7–18)
CALCIUM: 9.3 mg/dL (ref 8.5–10.1)
CREATININE: 1.68 mg/dL — AB (ref 0.60–1.30)
Chloride: 100 mmol/L (ref 98–107)
Co2: 31 mmol/L (ref 21–32)
EGFR (African American): 32 — ABNORMAL LOW
EGFR (Non-African Amer.): 27 — ABNORMAL LOW
Glucose: 95 mg/dL (ref 65–99)
OSMOLALITY: 281 (ref 275–301)
Potassium: 3.7 mmol/L (ref 3.5–5.1)
SODIUM: 140 mmol/L (ref 136–145)

## 2014-05-14 ENCOUNTER — Ambulatory Visit: Payer: Medicare Other | Admitting: Cardiovascular Disease

## 2014-06-05 ENCOUNTER — Emergency Department: Payer: Self-pay | Admitting: Emergency Medicine

## 2014-06-05 LAB — CBC WITH DIFFERENTIAL/PLATELET
Basophil #: 0 10*3/uL (ref 0.0–0.1)
Basophil %: 0.7 %
EOS ABS: 0.3 10*3/uL (ref 0.0–0.7)
Eosinophil %: 4.1 %
HCT: 42.9 % (ref 35.0–47.0)
HGB: 13.3 g/dL (ref 12.0–16.0)
LYMPHS ABS: 1.1 10*3/uL (ref 1.0–3.6)
Lymphocyte %: 16.8 %
MCH: 30 pg (ref 26.0–34.0)
MCHC: 31 g/dL — AB (ref 32.0–36.0)
MCV: 97 fL (ref 80–100)
MONO ABS: 0.4 x10 3/mm (ref 0.2–0.9)
MONOS PCT: 6.8 %
NEUTROS ABS: 4.5 10*3/uL (ref 1.4–6.5)
Neutrophil %: 71.6 %
Platelet: 233 10*3/uL (ref 150–440)
RBC: 4.44 10*6/uL (ref 3.80–5.20)
RDW: 14.6 % — AB (ref 11.5–14.5)
WBC: 6.3 10*3/uL (ref 3.6–11.0)

## 2014-06-05 LAB — COMPREHENSIVE METABOLIC PANEL
ALBUMIN: 3.6 g/dL (ref 3.4–5.0)
Alkaline Phosphatase: 64 U/L
Anion Gap: 6 — ABNORMAL LOW (ref 7–16)
BUN: 26 mg/dL — AB (ref 7–18)
Bilirubin,Total: 0.3 mg/dL (ref 0.2–1.0)
CREATININE: 1.6 mg/dL — AB (ref 0.60–1.30)
Calcium, Total: 9.3 mg/dL (ref 8.5–10.1)
Chloride: 104 mmol/L (ref 98–107)
Co2: 31 mmol/L (ref 21–32)
EGFR (African American): 39 — ABNORMAL LOW
GFR CALC NON AF AMER: 32 — AB
Glucose: 108 mg/dL — ABNORMAL HIGH (ref 65–99)
OSMOLALITY: 287 (ref 275–301)
Potassium: 4.9 mmol/L (ref 3.5–5.1)
SGOT(AST): 21 U/L (ref 15–37)
SGPT (ALT): 19 U/L
Sodium: 141 mmol/L (ref 136–145)
TOTAL PROTEIN: 7.2 g/dL (ref 6.4–8.2)

## 2014-06-08 ENCOUNTER — Ambulatory Visit: Payer: Medicare Other | Admitting: Cardiovascular Disease

## 2014-06-20 ENCOUNTER — Ambulatory Visit (INDEPENDENT_AMBULATORY_CARE_PROVIDER_SITE_OTHER): Payer: Medicare Other | Admitting: Cardiovascular Disease

## 2014-06-20 ENCOUNTER — Encounter: Payer: Self-pay | Admitting: Cardiovascular Disease

## 2014-06-20 VITALS — BP 130/70 | HR 78 | Ht 63.0 in | Wt 169.2 lb

## 2014-06-20 DIAGNOSIS — I4891 Unspecified atrial fibrillation: Secondary | ICD-10-CM

## 2014-06-20 DIAGNOSIS — R079 Chest pain, unspecified: Secondary | ICD-10-CM

## 2014-06-20 DIAGNOSIS — I5032 Chronic diastolic (congestive) heart failure: Secondary | ICD-10-CM

## 2014-06-20 DIAGNOSIS — I10 Essential (primary) hypertension: Secondary | ICD-10-CM

## 2014-06-20 DIAGNOSIS — R600 Localized edema: Secondary | ICD-10-CM

## 2014-06-20 DIAGNOSIS — K5791 Diverticulosis of intestine, part unspecified, without perforation or abscess with bleeding: Secondary | ICD-10-CM

## 2014-06-20 DIAGNOSIS — E785 Hyperlipidemia, unspecified: Secondary | ICD-10-CM

## 2014-06-20 MED ORDER — POTASSIUM CHLORIDE ER 10 MEQ PO TBCR: 10.0000 meq | EXTENDED_RELEASE_TABLET | Freq: Every day | ORAL | Status: DC | PRN

## 2014-06-20 MED ORDER — FUROSEMIDE 20 MG PO TABS: 20.0000 mg | ORAL_TABLET | Freq: Two times a day (BID) | ORAL | Status: DC | PRN

## 2014-06-20 NOTE — Assessment & Plan Note (Signed)
Several bleeds in 2015, April and June. Currently not on anticoagulation. Followed by Dr. Vira Agar

## 2014-06-20 NOTE — Assessment & Plan Note (Signed)
Currently on Crestor 10 mg daily 

## 2014-06-20 NOTE — Patient Instructions (Addendum)
Your next appointment will be scheduled in our new office located at :  [place]  [address], [place], [place] [address]  You are doing well. No medication changes were made.  Please take lasix up to twice a day as needed for leg edema/swelling I suspect you have at least 4 pounds of water  Please call us if you have new issues that need to be addressed before your next appt.  Your physician wants you to follow-up in: 6 months.  You will receive a reminder letter in the mail two months in advance. If you don't receive a letter, please call our office to schedule the follow-up appointment.

## 2014-06-20 NOTE — Assessment & Plan Note (Signed)
Chronic atrial fibrillation. Not on anticoagulation given recurrent GI bleeding. She is comfortable with her decision. She is aware of the risk and benefit of anticoagulation

## 2014-06-20 NOTE — Progress Notes (Signed)
Patient ID: Tabitha Potts, female    DOB: February 02, 1926, 80 y.o.   MRN: [id number]  HPI Comments: Tabitha Potts is a very pleasant 80 year old woman with a history of hemorrhoidal bleeding who presented to the hospital 06/09/2013 with GI bleeding felt secondary to hemorrhoids and diverticulitis, new onset atrial fibrillation with RVR. It was felt she had developed atrial fibrillation in early October 2014. Prior EKGs earlier in 2014, July, had shown normal sinus rhythm.In the hospital, She was started on Cardizem, digoxin but this was later held. Also started on anticoagulation. Norvasc and ACE inhibitor HCTZ combo was held  for low blood pressure. Creatinine in the hospital 1.47, BUN 26  She presents for routine followup  Now in chronic atrial fibrillation. No significant symptoms. She does report recent weight gain, worsening lower extremity edema. She's not taking Lasix on a regular basis Recent evaluation in the emergency room 3 weeks ago for severe headache, hypertension Given pain medication in the ER, prednisone prescription which she did not take Symptoms seemed to resolve after one or 2 days, no further symptoms Occasionally he has chest tightness at rest nothing with exertion. Overall she feels well currently. Not doing regular exercise She's not taking anticoagulation. Recurrent bleeding as below. Hemorrhoids have been okay  Previously started on amiodarone in an attempt to pharmacologically cardiovert her to normal sinus rhythm. She remained in atrial fibrillation.   she underwent cardioversion on September 28 2013 which was successful in restoring normal sinus rhythm Followup in clinic after episode of insomnia, a throbbing in her head. She was in normal sinus rhythm On her way into the clinic, she hit her foot on her walker and suffered a large hematoma.  Swelling got worse that night and she had severe pain that was 10 over 10. She went to the emergency room but was sent back home.  Bruising/hematoma  resolved. Also reports having significant stress when her son-in-law was visiting. He was eating everything in the house She converted back to atrial fibrillation during these events.    April 2015, she had a recurrent hemorrhoidal/diverticuli bleed. eliquis was held at the time of her discharge from the hospital. She saw Dr. Aleen Sells   She elected to restart her eliquis 2.5 mg twice a day. She did take an occasional 5 mg  On this regimen, she had recurrent bleed . She was admitted to the hospital 02/17/2014. No significant change in her hematocrit.  Colonoscopy was performed . Uncertain if bleeding was from internal hemorrhoids or diverticuli .  She presents today and reports having continued trace bleeding which she feels is from her internal hemorrhoids. She is scheduled to see surgery for possible banding. She's not taking anything except for low-dose aspirin.  Echocardiogram in the hospital 06/09/2013 shows ejection fraction 60-65%, normal right ventricular systolic pressure, mildly dilated left atrium  Hematocrit 06/10/2013 was 35   Since stress test in 2009  Echocardiogram 04/13/2013 with same results as above   EKG shows atrial fibrillation with rate 78 beats per minute, nonspecific ST abnormality    Outpatient Encounter Prescriptions as of 06/20/2014  Medication Sig  . acetaminophen (TYLENOL) 325 MG tablet Take 650 mg by mouth every 6 (six) hours as needed.  . ALPRAZolam (XANAX) 0.25 MG tablet Take 0.25 mg by mouth at bedtime as needed.   Marland Kitchen aspirin EC 81 MG tablet Take 1 tablet (81 mg total) by mouth daily.  Marland Kitchen diltiazem (DILACOR XR) 240 MG 24 hr capsule Take 1 capsule (240 mg  total) by mouth daily.  . furosemide (LASIX) 20 MG tablet Take 1 tablet (20 mg total) by mouth 2 (two) times daily as needed.  . meclizine (ANTIVERT) 25 MG tablet Take 25 mg by mouth 3 (three) times daily as needed.   . Multiple Vitamin (MULTIVITAMIN) tablet Take 1 tablet by mouth daily.   . NON FORMULARY Stool Softner 100 mg twice daily.  Marland Kitchen olmesartan-hydrochlorothiazide (BENICAR HCT) 40-25 MG per tablet Take 1 tablet by mouth daily.  Marland Kitchen PREMARIN vaginal cream Place 1 Applicatorful vaginally as needed.   . rosuvastatin (CRESTOR) 10 MG tablet Take 10 mg by mouth daily.  . [DISCONTINUED] hydrocortisone-pramoxine (ANALPRAM-HC) 2.5-1 % rectal cream Place 1 application rectally 2 (two) times daily.     Review of Systems  Constitutional: Negative.   Eyes: Negative.   Respiratory: Negative.   Cardiovascular: Positive for chest pain and leg swelling.  Gastrointestinal: Negative.   Endocrine: Negative.   Musculoskeletal: Negative.   Skin: Negative.   Allergic/Immunologic: Negative.   Neurological: Positive for headaches.  Hematological: Negative.   Psychiatric/Behavioral: Negative.   All other systems reviewed and are negative.   BP 130/70  Pulse 78  Ht 5\' 3"  (1.6 m)  Wt 169 lb 4 oz (76.771 kg)  BMI 29.99 kg/m2   Physical Exam  Nursing note and vitals reviewed. Constitutional: She is oriented to person, place, and time. She appears well-developed and well-nourished.  HENT:  Head: Normocephalic.  Nose: Nose normal.  Mouth/Throat: Oropharynx is clear and moist.  Eyes: Conjunctivae are normal. Pupils are equal, round, and reactive to light.  Neck: Normal range of motion. Neck supple. No JVD present.  Cardiovascular: Normal rate, S1 normal, S2 normal, normal heart sounds and intact distal pulses.  An irregularly irregular rhythm present. Exam reveals no gallop and no friction rub.   No murmur heard. Trace to 1+ pitting lower extremity edema  Pulmonary/Chest: Effort normal and breath sounds normal. No respiratory distress. She has no wheezes. She has no rales. She exhibits no tenderness.  Abdominal: Soft. Bowel sounds are normal. She exhibits no distension. There is no tenderness.  Musculoskeletal: Normal range of motion. She exhibits no edema and no tenderness.   Lymphadenopathy:    She has no cervical adenopathy.  Neurological: She is alert and oriented to person, place, and time. Coordination normal.  Skin: Skin is warm and dry. No rash noted. No erythema.  Psychiatric: She has a normal mood and affect. Her behavior is normal. Judgment and thought content normal.    Assessment and Plan

## 2014-06-20 NOTE — Assessment & Plan Note (Signed)
Blood pressure is well controlled on today's visit. No changes made to the medications. 

## 2014-06-20 NOTE — Assessment & Plan Note (Signed)
Acute on chronic diastolic CHF on today's visit. Worsening leg edema. We have recommended she restart her Lasix daily, possibly twice a day with potassium for worsening swelling. She will go back to Lasix as needed or every other day once her edema has improved

## 2014-06-20 NOTE — Assessment & Plan Note (Signed)
Leg edema consistent with acute on chronic diastolic CHF. Recommended she restart her Lasix with potassium

## 2014-06-25 ENCOUNTER — Encounter: Payer: Self-pay | Admitting: Cardiovascular Disease

## 2014-09-24 ENCOUNTER — Other Ambulatory Visit: Payer: Self-pay | Admitting: *Deleted

## 2014-09-24 MED ORDER — DILTIAZEM HCL ER 240 MG PO CP24: 240.0000 mg | ORAL_CAPSULE | Freq: Every day | ORAL | Status: DC

## 2014-11-14 ENCOUNTER — Ambulatory Visit: Payer: Self-pay | Admitting: Ophthalmology

## 2014-11-27 ENCOUNTER — Ambulatory Visit
Admit: 2014-11-27 | Disposition: A | Discharge status: Home / Self Care | Payer: Self-pay | Attending: Ophthalmology | Admitting: Ophthalmology

## 2014-12-14 NOTE — Consult Note (Signed)
General Aspect Tabitha Potts is a 81yo Caucasian female w/ PMHx s/f HTN, HLD, OA and h/o hemorrhoids and diverticulosis who was admitted to [facility] today with hematochezia and new onset a-fib with RVR.   She reports experiencing fatigue, particularly upon attempting household chores such as dusting, for the past 3 weeks. No palpitations or lightheadedness. No chest pain, shortness of breath, swelling, weight gain or syncope. She does note significant constipation and reduced PO intake. She has a history of hemorroids and occasional blood in her stool as a result. Most recently, she has noticed an increased amount of bleeding.  Today she went to urgent care for her hemorroids and was noted to be in atrial fibrilation. She was sent to the ER.   She had an echo  "two months ago" which was "normal." She has had a stress test in the past which was "normal." No prior cardiac history.   Present Illness In the ED, telemetry indicated atrial fibrillation with RVR (rates 130-150s). BMET- BUN 26/Cr 1.47, otherwise unremarkable. CBC w/o evidence of anemia or leukocytosis. Cardiac markers WNL x 1. TSH normal at 1.70. U/a unremarkable. CXR demonstarted stable R hemidiaphragm elevation with mild atelectasis, otherwise no acute process noted. She was started on a Cardizem gtt and on 10 mg/hr had better rate control to the 80s.   PAST MEDICAL HISTORY:  1. Hypertension.  2. Hyperlipidemia.  3. Osteoarthritis.  4. Hemorrhoids 5. Diverticulosis  PAST SURGICAL HISTORY:  1. Right hip replacement.  2. Right knee arthroscopy.  3. Back surgery.  4. Neck surgery.  5. Foot surgery.   SOCIAL HISTORY: No history of smoking, drinking alcohol or using illicit drugs. Lives by herself. Has good family support.   FAMILY HISTORY: Both parents died of old age, she is unaware of details.   Physical Exam:  GEN no acute distress, thin   HEENT pink conjunctivae, PERRL, hearing intact to voice   NECK supple  No masses  trachea  midline  no JVD or bruits   RESP normal resp effort  clear BS  no use of accessory muscles   CARD Irregular rate and rhythm  Normal, S1, S2  No murmur   ABD denies tenderness  soft  hypoactive BS   EXTR negative cyanosis/clubbing, negative edema   SKIN normal to palpation, skin turgor good   NEURO follows commands, motor/sensory function intact   PSYCH alert, A+O to time, place, person   Review of Systems:  Subjective/Chief Complaint fatigue, bloody stool   General: Fatigue  Weakness  reduced appetite   Skin: No Complaints    ENT: No Complaints    Eyes: No Complaints    Neck: No Complaints    Respiratory: No Complaints   Cardiovascular: No Complaints   Gastrointestinal: Constipation  Rectal Bleeding   Genitourinary: hemmorroids    Vascular: No Complaints    Musculoskeletal: No Complaints    Neurologic: No Complaints    Hematologic: No Complaints    Endocrine: No Complaints    Psychiatric: No Complaints    Review of Systems: All other systems were reviewed and found to be negative   Medications/Allergies Reviewed Medications/Allergies reviewed    Home Medications:  Medication Instructions Status  Tylenol Extra Strength tablet 500 mg 2 tab(s) orally every 4 hours x 5 days as needed   Active  Crestor tablet 10 mg 1 tab(s) orally once a day (at bedtime) x 30 days  Active  furosemide 20 mg oral tablet 1  orally once a day  Active  Norvasc 5 mg oral tablet 1 tab(s) orally once a day Active  Aspir 81 81 mg oral delayed release tablet 1 tab(s) orally once a day Active  Benicar HCT 25 mg-40 mg oral tablet 1 tab(s) orally once a day Active  multivitamin 1 tab(s) orally once a day Active  potassium chloride 20 mEq oral tablet, extended release 0.5 tab(s) orally once a day Active   Lab Results:  Thyroid:  17-Oct-14 04:56   Thyroid Stimulating Hormone 1.74 (0.45-4.50 (International Unit)  ----------------------- Pregnant patients have  different reference   ranges for TSH:  - - - - - - - - - -  Pregnant, first trimetser:  0.36 - 2.50 uIU/mL)  Routine Chem:  17-Oct-14 04:56   Glucose, Serum 93  BUN  23  Creatinine (comp)  1.44  Sodium, Serum 137  Potassium, Serum 4.5  Chloride, Serum 103  CO2, Serum 30  Calcium (Total), Serum 8.9  Anion Gap  4  Osmolality (calc) 277  eGFR (African American)  38  eGFR (Non-African American)  33 (eGFR values <4m/min/1.73 m2 may be an indication of chronic kidney disease (CKD). Calculated eGFR is useful in patients with stable renal function. The eGFR calculation will not be reliable in acutely ill patients when serum creatinine is changing rapidly. It is not useful in  patients on dialysis. The eGFR calculation may not be applicable to patients at the low and high extremes of body sizes, pregnant women, and vegetarians.)  Cholesterol, Serum 148  Triglycerides, Serum 97  HDL (INHOUSE) 52  VLDL Cholesterol Calculated 19  LDL Cholesterol Calculated 77 (Result(s) reported on 09 Jun 2013 at 05:14AM.)  Cardiac:  17-Oct-14 03:07   CK, Total 101  CPK-MB, Serum 2.7 (Result(s) reported on 09 Jun 2013 at 04:57AM.)  Troponin I < 0.02 (0.00-0.05 0.05 ng/mL or less: NEGATIVE  Repeat testing in 3-6 hrs  if clinically indicated. >0.05 ng/mL: POTENTIAL  MYOCARDIAL INJURY. Repeat  testing in 3-6 hrs if  clinically indicated. NOTE: An increase or decrease  of 30% or more on serial  testing suggests a  clinically important change)  Routine Hem:  17-Oct-14 04:56   WBC (CBC) 7.8  RBC (CBC)  3.71  Hemoglobin (CBC)  11.9  Hematocrit (CBC) 35.0  Platelet Count (CBC) 234  MCV 94  MCH 32.1  MCHC 34.1  RDW 14.3  Neutrophil % 70.0  Lymphocyte % 16.3  Monocyte % 8.7  Eosinophil % 4.4  Basophil % 0.6  Neutrophil # 5.5  Lymphocyte # 1.3  Monocyte # 0.7  Eosinophil # 0.3  Basophil # 0.0 (Result(s) reported on 09 Jun 2013 at 05:12AM.)   EKG:  Interpretation EKG shows Atrial fibrillation with rate  114 bpm   Radiology Results:  XRay:    16-Oct-14 18:40, Chest Portable Single View  Chest Portable Single View   REASON FOR EXAM:    weak  COMMENTS:       PROCEDURE: DXR - DXR PORTABLE CHEST SINGLE VIEW  - Jun 08 2013  6:40PM     RESULT: Comparison is made to the study of 03/16/2013. There is continued   elevation of the right hemidiaphragm. There is a band of presumed   atelectasis in the right lung base. The lungs are otherwise clear. The   heart and pulmonary vessels are normal. The bony and mediastinal   structures are unremarkable. There is no effusion. There is no   pneumothorax or evidence of congestive failure.    IMPRESSION:  Stable  elevation of the right hemidiaphragm with minimal   right lung base atelectasis.  Dictation Site: 1        Verified By: Sundra Aland, M.D., MD    Iodinated Radiocontrast Dyes: Wheezing, Agitation  Other -Explain in Comment Field: Other  Codeine: Unknown  PCN: Unknown  Celebrex: Unknown  Vital Signs/Nurse's Notes:  **Vital Signs.:   17-Oct-14 10:55  Vital Signs Type Admission  Temperature Temperature (F) 98  Celsius 36.6  Temperature Source oral  Pulse Pulse 80  Respirations Respirations 24  Systolic BP Systolic BP 594  Diastolic BP (mmHg) Diastolic BP (mmHg) 73  Mean BP 93  Pulse Ox % Pulse Ox % 90  Pulse Ox Activity Level  At rest  Oxygen Delivery Room Air/ 21 %    Impression 81yo Caucasian female w/ PMHx s/f HTN, HLD, OA and h/o hemorrhoids and diverticulosis who was admitted to [facility] today with hematochezia and new onset a-fib with RVR.  1. New onset atrial fibrillation with RVR 3 week history of fatigue and reduced functional capacity  Chronicity of atrial fib likely > 48 hours.  Will need to obtain OP echo to assess LA. Patient likely has structural cardiac changes predisposing to a-fib. Longstanding HTN could be one contributor. Rate is adequately controlled on Cardizem gtt. BP was a bit  low (90-114/50-64).  CHADSVASc at least 3 (HTN, age > 37, female). No active bleeding. No anemia.  -- Hold home Norvasc, ACEi/HCTZ combo -- Transition to Cardizem 30m PO q6hr (hold for SBP < 100) -- Add digoxin- load 0.249mPO today, then 0.125 mg daily starting tomorrow -- Hold ASA -- Initiate heparin gtt (no bolus) and monitor for GI bleeding prior to starting oral anticoagulation -- Obtain echo and prior stress tests, outpt -- Monitor on telemetry  2. CKD, stage III BUN 26/Cr 1.47. Appears to be near baseline.  -- Hold Lasix  -- Monitor BMETs as CrCl could affect chioce/dosing of anticoagulant  3. Hematochezia Patient notes significant constipation. Suspect hemorrhoidal or diverticular source. Further work-up per primary team/GI.  -- Liberal use of stool softeners -- Monitor for recurrent GI bleeding on heparin  4. Hypertension BP actually labile in the ED- 90-114/50-64 -- Hold home BP meds as above.  5. Hyperlipidemia -- Check lipid panel -- Continue statin   Electronic Signatures: Sopheap Boehle A (PA-C)  (Signed 17-Oct-14 10:10)  Authored: General Aspect/Present Illness, History and Physical Exam, Review of System, Home Medications, Labs, Allergies, Impression/Plan GoIda RogueMD)  (Signed 18-Oct-14 09:15)  Authored: General Aspect/Present Illness, Review of System, Home Medications, EKG , Radiology, Vital Signs/Nurse's Notes, Impression/Plan  Co-Signer: General Aspect/Present Illness, History and Physical Exam, Review of System, Home Medications, Labs, Allergies, Impression/Plan   Last Updated: 18-Oct-14 09:15 by GoIda RogueMD)

## 2014-12-14 NOTE — H&P (Signed)
PATIENT NAME:  Tabitha Potts, Tabitha Potts MR#:  [id number] DATE OF BIRTH:  12/06/1925  DATE OF ADMISSION:  06/08/2013  PRIMARY CARE PHYSICIAN: Dr. Lamonte Sakai.   REFERRING PHYSICIAN: Dr. Conni Slipper.   CHIEF COMPLAINT: Bright red blood per rectum and atrial fibrillation with rapid ventricular rate.   HISTORY OF PRESENT ILLNESS: Tabitha Potts is an 81 year old, pleasant, white female with past medical history of hypertension, hyperlipidemia, history of diverticulosis in the past per colonoscopy from 2007. Presented to the Emergency Department with complaints of constipation of 2 to 3 deep days' duration and bright red blood per rectum of 2 episodes since yesterday. The patient states she has been constipated for the last 2 days. Has been trying to strain to have a bowel movement. Noticed to have bright red blood per rectum moderate to large amount. Concerning this, came to the Emergency Department. In the Emergency Department, the patient was found to be in atrial fibrillation with rapid ventricular rate. The patient was initially given a Cardizem bolus without much improvement. Consequently, the patient was started on Cardizem drip. The patient's initial hemoglobin is 13.5. The patient denies having any palpitations. No previous history of atrial fibrillation. Denies having any chest pain. However, has been feeling severe generalized weakness and decreased appetite. No obvious signs of infection are found. Urinalysis does not show any signs of infection. TSH is 1.70.   PAST MEDICAL HISTORY:  1. Hypertension.  2. Hyperlipidemia.  3. Osteoarthritis.   PAST SURGICAL HISTORY:  1. Right hip replacement.  2. Right knee arthroscopy.  3. Back surgery.  4. Neck surgery.  5. Foot surgery.   ALLERGIES:  1. Iodinated radiocontrast.  2. Codeine.  3. Penicillin.  Marland Kitchen   HOME MEDICATIONS:  1. Potassium.  2. Norvasc 5 mg once a day.  3. Lasix 20 mg daily.  4. Crestor 10 mg daily.  5. Benazepril/hydrochlorothiazide  40/25 mg once a day.  6. Aspirin 81 mg daily.   SOCIAL HISTORY: No history of smoking, drinking alcohol or using illicit drugs. Lives by herself. Has good family support.   FAMILY HISTORY: Both parents died of old age.   REVIEW OF SYSTEMS:  CONSTITUTIONAL: Generalized weakness.  EYES: No change in vision.  ENT: No change in hearing.  RESPIRATORY: No cough, shortness of breath.  CARDIOVASCULAR: No chest pain, palpitations.  GASTROINTESTINAL: No nausea, vomiting, abdominal pain but has been constipated.  NEUROLOGIC: No dysuria or hematuria.  ENDOCRINE: No polyuria or polydipsia.  HEMATOLOGIC: No easy bruising or bleeding except for bright red blood per rectum.  SKIN: No rash or lesions.  MUSCULOSKELETAL: History of arthritis.  NEUROLOGIC: No weakness or numbness in any part of the body.   PHYSICAL EXAMINATION:  GENERAL: This is a well-built, well-nourished, age-appropriate female lying down in the bed, not in distress.  VITAL SIGNS: Temperature is 98.5, pulse 120, blood pressure 113/55, respiratory rate of 18, oxygen saturation is 94% on room air.  HEENT: Head normocephalic, atraumatic. There is no scleral icterus. Conjunctivae normal. Pupils equal and react to light. Mucous membranes moist.  NECK: Supple. No lymphadenopathy. No JVD. No carotid bruit. No thyromegaly.  CHEST: Has no focal tenderness.  LUNGS: A few crackles are heard bibasilar, somewhat right more than left.  HEART: S1, S2. Irregularly irregular, tachycardia.  ABDOMEN: Bowel sounds present. Soft, nontender, nondistended.  EXTREMITIES: No pedal edema. Pulses 2+.  NEUROLOGIC: The patient is alert, oriented to place, person and time. Cranial nerves II through XII intact. Motor 5/5 in upper and lower  extremities. No sensory deficits.  SKIN: No rash or lesions.  MUSCULOSKELETAL: Good range of motion in all of the extremities.   LABORATORIES: Troponin less than 0.02 x 2. TSH 1.70. UA negative for nitrites and leukocyte  esterase.   CMP: BUN 26, creatinine of 1.47. The rest of all of the values are within normal limits.   EKG: Atrial fibrillation with rapid ventricular rate.   ASSESSMENT AND PLAN: Tabitha Potts is an 81 year old female who comes to the Emergency Department with constipation and is also found to have atrial fibrillation with rapid ventricular rate.  1. Atrial fibrillation with rapid ventricular rate: New onset. TSH is within normal limits. No obvious signs of infection are found. Per the patient, had recent echocardiogram done about 1 month back. Per the patient, it was normal. Will obtain report from the primary care physician's office. Will hold the echocardiogram for now as the patient had a recent echocardiogram.  2. Bright red blood per rectum: Either this is a diverticular bleed or from hemorrhoids. Will continue to follow up. The patient's hemoglobin is 13.5. Will continue with gentle hydration concerning the patient's acute renal insufficiency.  3. Hypertension: Currently well controlled. Continue with the home medications except for hydrochlorothiazide and Lasix.  4. Mild acute renal insufficiency secondary to dehydration: Will continue with intravenous fluids and follow up.  5. Keep the patient on deep vein thrombosis prophylaxis with sequential compression devices.   TIME SPENT: 45 minutes.   ____________________________ Tabitha Becton, MD pv:gb D: 06/08/2013 22:43:12 ET T: 06/08/2013 23:16:38 ET JOB#: [id number]  cc: Tabitha Becton, MD, <Dictator> Perrin Maltese, MD Grier Mitts Arlenne Kimbley MD ELECTRONICALLY SIGNED 06/24/2013 3:05

## 2014-12-15 NOTE — H&P (Signed)
PATIENT NAME:  Tabitha Potts, Tabitha Potts MR#:  [id number] DATE OF BIRTH:  29-Sep-1925  DATE OF ADMISSION:  12/06/2013  PRIMARY CARE PHYSICIAN:  Lamonte Sakai, MD.   PRIMARY CARDIOLOGIST: Dr. Rockey Situ.   CHIEF COMPLAINT: Bright red blood per rectum and dizziness.   HISTORY OF PRESENTING ILLNESS: An 81 year old Caucasian female patient with history of atrial fibrillation on Eliquis, hypertension, hyperlipidemia presents to the Emergency Room complaining of worsening bright red blood per rectum. The patient has had hemorrhoids for a long time. Has had on-and-off bleeding , but lately has had frank blood. She mentions that at times she feels like she is having a bowel movement but notices only bright blood. Today, she had large volume of blood gushing out and presented to the Emergency Room, felt a little dizzy.   PAST MEDICAL HISTORY: The patient has not had any procedures. Her last colonoscopy was about 6 years back when she was found to have diverticulosis. It was done by Dr. Vira Agar. She did follow up with him 1 year back, did not recommend any procedures.   Today, her hemoglobin is 14.5.   PAST MEDICAL HISTORY:  1. Hypertension, hyperlipidemia, and osteoarthritis.  2. Hemorrhoids.  3. Diverticulosis.  4. Atrial fibrillation on Eliquis and amiodarone.   PAST SURGICAL HISTORY:  1. Right hip replacement.  2. Right knee arthroscopy.  3. Back surgery.  4. Neck surgery. 5. Foot surgery.   ALLERGIES:  IODINATED RADIAL CONTRAST.  CODEINE.   SOCIAL HISTORY: The patient does not smoke. No alcohol. No illicit drugs. Lives alone. Ambulates with a walker.   FAMILY HISTORY: Both parents died of old age.   REVIEW OF SYSTEMS:  CONSTITUTIONAL: Complains of weakness, some dizziness.  EYES: No blurred vision, pain or redness.  EARS, NOSE, AND THROAT:  No tinnitus, ear pain, hearing loss.  RESPIRATORY: No cough, wheeze, hemoptysis.  CARDIOVASCULAR: No chest pain, orthopnea, and does have lower extremity edema,  which is chronic.  GASTROINTESTINAL: No nausea, vomiting, diarrhea, abdominal pain. Does have bright red blood per rectum.  GENITOURINARY: No dysuria, hematuria, or frequency.  ENDOCRINE: No polyuria, nocturia, or thyroid problems.  HEMATOLOGY AND LYMPHATIC: No anemia, easy bruising. Does have bright red bleeding.  INTEGUMENTARY: No acne, rash, lesion.  MUSCULOSKELETAL: Has arthritis.  NEUROLOGIC: No focal numbness, weakness, seizure.  PSYCHIATRIC: No anxiety, depression.   HOME MEDICATIONS:  1. Amiodarone 200 mg oral once a day.  2. Crestor 10 mg daily.  3. Diltiazem 240 mg oral once a day.  4. Eliquis 5 mg oral 2 times a day.  5. Lasix 20 mg daily.  6. Multivitamin 1 tablet daily. 7. Xanax 0.25 mg oral once a day as needed.   PHYSICAL EXAMINATION:  VITAL SIGNS: Temperature 98, pulse is 75, respirations 18, blood pressure 174/86, saturating 93%.  GENERAL: Elderly Caucasian female patient lying in bed, comfortable, conversational, cooperative with exam.  PSYCHIATRIC: He is alert and oriented x 3, pleasant.  HEENT: Normocephalic. Oral mucosa moist and pink. External ears and nose normal. No pallor. No icterus. Pupils bilaterally equal and reactive to light.  NECK: Supple. No thyromegaly. No palpable lymph nodes. Trachea midline. No carotid bruit, JVD.  CARDIOVASCULAR: s1, s2  systolic murmur. Peripheral pulses 2+. Irregular.  RESPIRATORY: Normal work of breathing. Clear to auscultation on both sides. GASTROINTESTINAL: Soft abdomen, nontender. Bowel sounds present. No hepatosplenomegaly palpable.  SKIN: Warm and dry. No petechiae, rash, ulcers.  MUSCULOSKELETAL: No joint swelling, redness, effusion of the large joints. Normal muscle tone.  NEUROLOGICAL: Motor  strength 5/5 in upper and lower extremities. Sensation to fine touch intact all over.  EXTREMITIES: Has 2+ edema.  GENITOURINARY: No CVA tenderness or bladder distention.   LABORATORY STUDIES: Show glucose 102, BUN 28, creatinine  1.60, sodium 136, potassium 4.4, with chloride 101. AST, ALT, alkaline phosphatase, bilirubin normal with WBC 7.3, hemoglobin 12.7, platelets of 212,000. MCV of 95.   EKG shows atrial fibrillation.   Blood group is O negative.   INR 1 and PTT 36.4.  Urinalysis shows 1+ bacteria and 1 WBC.   ASSESSMENT AND PLAN:  1. Bright red blood per rectum in a patient who is on Eliquis. The patient does have history of hemorrhoids and has had on-and-off mild bleeding for a long time, but this has worsened recently, and today, she noticed large volume blood which was gushing out, felt dizzy. We will admit patient under observation as the bleeding could still be secondary to hemorrhoids. We will hold the Eliquis.  If hemoglobin has not dropped, patient can be discharged home to follow up as outpatient with surgery and can be started on Eliquis. If there is significant drop in hemoglobin, she will need a colonoscopy or a tagged RBC scan. We will check a hemoglobin today evening and tomorrow morning.  2. Urinary frequency. The patient does have bacteria in the urine but only 1 WBC, but considering she does have symptoms was started on ciprofloxacin.  3. Fatigue and dizziness has been a chronic problem. I suspect this is secondary to her atrial fibrillation which can cause significant fatigue, but patient is also taking Xanax at night as scheduled. I have advised her to take it as needed. The patient does seem to have early signs of dementia. I have discussed with her regarding starting a medication, but she does not feel she has depression and would like to follow with her primary care physician at this time.  4. Hypertension, mildly elevated. We will add a small dose lisinopril as patient does have chronic kidney disease stage III.  5. Deep venous thrombosis prophylaxis.   Patient is on Eliquis, but it is being held secondary to the rectal bleed.   CODE STATUS: Full.  The patient does mention that she wishes to be DO  NOT RESUSCITATE, but her son at bedside has requested more time to discuss with the patient and presently will be full code.   TIME SPENT TODAY ON THIS CASE:  40 minutes.    ____________________________ Leia Alf Pharrell Ledford, MD srs:dd D: 12/06/2013 13:53:00 ET T: 12/06/2013 19:56:16 ET JOB#: [id number]  cc: Perrin Maltese, MD Minna Merritts, MD Makia Bossi R. Dema Timmons, MD, <Dictator>   Neita Carp MD ELECTRONICALLY SIGNED 12/08/2013 12:52

## 2014-12-15 NOTE — Consult Note (Signed)
Pt with a small polyp removed with cold snare from sigmoid colon.  Scattered small diverticuli in sigmoid colon seen, internal hemorrhoids also.  Will advance to regular diet and she can be discharged per Hospitalist plans.  Would go home on 81mg  ASA and follow up with cardiology.  If he wants her on low dose Eliquis or similar medication that is between Hospitalist and cardiologist.  Electronic Signatures: Manya Silvas (MD)  (Signed on 30-Jun-15 14:24)  Authored  Last Updated: 30-Jun-15 14:24 by Manya Silvas (MD)

## 2014-12-15 NOTE — Consult Note (Signed)
General Aspect Tabitha Potts is a 81yo Caucasian female w/ PMHx s/f HTN, HLD, chronic atrial fib on eliquis 2.5 mg po BID, OA and h/o hemorrhoids and diverticulosis who was admitted to [facility] today with lower GI bleed.  Hx of  hemorroids and diverticulosis, lower GI bleed in the past on eliquis.  She reports LGIB  for the last week. she had another episode of bleeding as an outpt on Eliquis,  bleeding resolved off eliquis,  recurrent bleed after restarting eliquis. She was initially on eliquis 2.5 mg po BID for a few days with no bleeding. She actually increased the dose up to 5 mg in the am and 2.5 mg in the PM (increased dose on her own).  bleeding then increased on eliquis 5 mg in Am over the last 24 hours, which prompted her to come to the Emergency Department.  hemoglobin stable.  The patient reports previous crampy abdominal pain, resolved. CT abdomen and pelvis was significant for mild diverticulosis.   last colonoscopy was almost seven years ago, done by Dr. Vira Agar.   worsening baseline chronic kidney disease from creatinine of 1.5 to 1.94, and mild hyponatremia at 124.   Present Illness . PAST MEDICAL HISTORY:  1. Hypertension.  2. Hyperlipidemia.  3. Osteoarthritis.  4. Hemorrhoids 5. Diverticulosis  PAST SURGICAL HISTORY:  1. Right hip replacement.  2. Right knee arthroscopy.  3. Back surgery.  4. Neck surgery.  5. Foot surgery.   SOCIAL HISTORY:  No history of smoking, drinking alcohol or using illicit drugs. Lives by herself. Has good family support.   FAMILY HISTORY:  Both parents died of old age, she is unaware of details.   Physical Exam:  GEN well developed, no acute distress, thin   HEENT pink conjunctivae, PERRL, hearing intact to voice   NECK supple  No masses  trachea midline  no JVD or bruits   RESP normal resp effort  clear BS  no use of accessory muscles   CARD Irregular rate and rhythm  Normal, S1, S2  No murmur   ABD denies tenderness  soft   hypoactive BS   EXTR negative cyanosis/clubbing, negative edema   SKIN normal to palpation, skin turgor good   NEURO follows commands, motor/sensory function intact   PSYCH alert, A+O to time, place, person   Review of Systems:  Subjective/Chief Complaint fatigue, bloody stool (now resolved)   General: Fatigue  Weakness  reduced appetite   Skin: No Complaints   ENT: No Complaints   Eyes: No Complaints   Neck: No Complaints   Respiratory: No Complaints   Cardiovascular: No Complaints   Gastrointestinal: Constipation  Rectal Bleeding   Genitourinary: hemmorroids   Vascular: No Complaints   Musculoskeletal: No Complaints   Neurologic: No Complaints   Hematologic: No Complaints   Endocrine: No Complaints   Psychiatric: No Complaints   Review of Systems: All other systems were reviewed and found to be negative   Medications/Allergies Reviewed Medications/Allergies reviewed   Lab Results:  Hepatic:  27-Jun-15 18:19   Bilirubin, Total 0.3  Alkaline Phosphatase 66 (45-117 NOTE: New Reference Range 07/14/13)  SGPT (ALT) 21  SGOT (AST) 18  Total Protein, Serum 7.5  Albumin, Serum 4.0  Routine Chem:  27-Jun-15 18:19   Glucose, Serum  103  BUN  37  Creatinine (comp)  1.94  Sodium, Serum  129  Potassium, Serum 3.8  Chloride, Serum  88  CO2, Serum  34  Calcium (Total), Serum 9.6  Osmolality (calc)  268  eGFR (African American)  27  eGFR (Non-African American)  23 (eGFR values <58m/min/1.73 m2 may be an indication of chronic kidney disease (CKD). Calculated eGFR is useful in patients with stable renal function. The eGFR calculation will not be reliable in acutely ill patients when serum creatinine is changing rapidly. It is not useful in  patients on dialysis. The eGFR calculation may not be applicable to patients at the low and high extremes of body sizes, pregnant women, and vegetarians.)  Anion Gap 7  Routine Coag:  27-Jun-15 18:19   Activated PTT  (APTT) 35.1 (A HCT value >55% may artifactually increase the APTT. In one study, the increase was an average of 19%. Reference: "Effect on Routine and Special Coagulation Testing Values of Citrate Anticoagulant Adjustment in Patients with High HCT Values." American Journal of Clinical Pathology 2006;126:400-405.)  Prothrombin 13.6  INR 1.1 (INR reference interval applies to patients on anticoagulant therapy. A single INR therapeutic range for coumarins is not optimal for all indications; however, the suggested range for most indications is 2.0 - 3.0. Exceptions to the INR Reference Range may include: Prosthetic heart valves, acute myocardial infarction, prevention of myocardial infarction, and combinations of aspirin and anticoagulant. The need for a higher or lower target INR must be assessed individually. Reference: The Pharmacology and Management of the Vitamin K  antagonists: the seventh ACCP Conference on Antithrombotic and Thrombolytic Therapy. CYFVCB.4496Sept:126 (3suppl): [id number] A HCT value >55% may artifactually increase the PT.  In one study,  the increase was an average of 25%. Reference:  "Effect on Routine and Special Coagulation Testing Values of Citrate Anticoagulant Adjustment in Patients with High HCT Values." American Journal of Clinical Pathology 2006;126:400-405.)  Routine Hem:  27-Jun-15 18:19   Hemoglobin (CBC) 13.6  WBC (CBC) 9.0  RBC (CBC) 4.40  Hematocrit (CBC) 41.6  Platelet Count (CBC) 243  MCV 95  MCH 31.0  MCHC 32.8  RDW 14.0  Neutrophil % 76.3  Lymphocyte % 12.7  Monocyte % 7.8  Eosinophil % 2.6  Basophil % 0.6  Neutrophil #  6.9  Lymphocyte # 1.1  Monocyte # 0.7  Eosinophil # 0.2  Basophil # 0.1 (Result(s) reported on 17 Feb 2014 at 06:30PM.)   EKG:  Interpretation Tele shows Atrial fibrillation with no significant ST or T wave changes, rate 70s    Iodinated Radiocontrast Dyes: Wheezing, Agitation  Other -Explain in Comment Field:  Other  Codeine: Unknown  PCN: Unknown  Celebrex: Unknown   Impression 89yoCaucasian female w/ PMHx s/f HTN, HLD, OA and h/o hemorrhoids and diverticulosis [facility]  1). LGIB No significant change in HCT, Suspect hemorrhoidal or diverticular source off eliquis now Last dose yesterday AM. She was supposed to continue eliquis 2.5 mg po BID (her choice to restart, after long discussions in the office).  She increased the dose recently to 5 mg in the AM, 2.5 mg in the PM.  She is very concerned about Afib and CVA. ------>Currently she feels well, would like to have colo but does not want to have to wait as an inpatient until Tuesday or Wednesday. She would like to do the GI prep at home.    2). CKD, stage III Above baseline  -- Hold Lasix and benicar HCT If renal function continues to run above baseline, consider starting alternate BP medication, such as clonidine  3) atrial fibrillation: chronic will restart outpt meds, eliquis on hold  4. Hypertension Agree  with restarting diltiazem benicar HCT on hold secondary to renal dysfunction  5. Hyperlipidemia -- Continue statin   Electronic Signatures: Ida Rogue (MD)  (Signed 28-Jun-15 16:19)  Authored: General Aspect/Present Illness, History and Physical Exam, Review of System, Home Medications, Labs, EKG , Allergies, Impression/Plan   Last Updated: 28-Jun-15 16:19 by Ida Rogue (MD)

## 2014-12-15 NOTE — Discharge Summary (Signed)
PATIENT NAME:  Tabitha Potts, Tabitha Potts MR#:  [id number] DATE OF BIRTH:  [birth year]  DATE OF ADMISSION:  12/06/2013 DATE OF DISCHARGE:  12/07/2013  ADMITTING DIAGNOSIS:  Gastrointestinal bleed.   DISCHARGE DIAGNOSES:   1.  Lower gastrointestinal bleed of unclear etiology, at this time it is resolved.  2.  Coagulopathy due to Eliquis, resolved with holding Eliquis. 3.  Mild bacteruria, suspected urinary tract infection.  Cultures pending.  4.  History of chronic kidney disease.  5.  Atrial fibrillation. 6.  Hypertension. 7.  Depression.  8.  Hyperlipidemia.  9.  Osteoarthritis.  10.  Hemorrhoids.  11.  Diverticulosis.   DISCHARGE CONDITION:  Stable.   DISCHARGE MEDICATIONS:  The patient is to resume Crestor tablet 10 mg by mouth daily, furosemide 20 mg by mouth daily, multivitamins once daily, diltiazem extended release 240 mg by mouth daily, amiodarone 200 mg by mouth daily, Xanax 0.25 mg by mouth at bedtime as needed.    NEW MEDICATIONS:  Aspirin 81 mg by mouth daily and ciprofloxacin 250 mg twice daily for two more days.  The patient is not to take Eliquis until recommended by primary care physician or gastroenterology.   HOME OXYGEN:  None.   DIET:  2 gram salt, low-fat, low-cholesterol, carbohydrate-controlled diet, mechanical soft.  Advance to regular as tolerated.   ACTIVITY LIMITATIONS:  As tolerated.   FOLLOWUP APPOINTMENT:  With Dr. Vira Agar in two days after discharge, actually on the 12/12/2013 at 1:00 p.m. in his office.  Follow-up appointment with primary care physician, Dr. Lamonte Sakai in 1 to 2 days after discharge.   CONSULTANTS:  Care management, social work.   RADIOLOGIC STUDIES:  None.   HISTORY AND HOSPITAL COURSE:  The patient is an 81 year old Caucasian female with history of hemorrhoids, history of diverticulosis, who had colonoscopy done in 2008 revealing small internal hemorrhoids as well as sigmoid diverticulosis, presented to the hospital with bright red blood per  rectum.  Please refer to Dr. Boykin Reaper admission note on 12/06/2013.  Apparently the patient was having on and off bleeding intermittently for a long period of time and the day prior to coming to the hospital she had a gush of blood and she felt somewhat dizzy.  Her vital signs on arrival to the Emergency Room were stable with temperature of 98, pulse was 75, respiratory rate was 18, blood pressure 174/86, saturation was 93%.  Physical exam was unremarkable.  The patient's lab data done in the Emergency Room revealed mild elevation of BUN and creatinine of 28 and 1.60, glucose 102, otherwise BMP was unremarkable.  The patient's lipase level was normal at 327.  Liver enzymes were normal.  CBC was within normal limits with hemoglobin level of 12.7.  Coagulation panel was unremarkable with Pro Time of 13.5, INR 1.0.  Urinalysis revealed 2+ blood, 2+ leukocyte esterase, 2 red blood cells, 1 white blood cell, 1+ bacteria, mucus was present as well as 1 hyaline cast and 1 epithelial cell.  EKG showed A. Fib at a rate of 80, low voltage QRS, but no acute ST-T changes.  The patient was admitted to the hospital for further evaluation.  Her hemoglobin was followed and it remained relatively stable.  On the day of admission 12/06/2013 the patient's hemoglobin level was 12.7 and was 12.1 on the day of discharge, 12/07/2013.  The patient's Eliquis was stopped and her bleeding has resolved.  The patient did have a bowel movement today on 12/07/2013 which had no blood.  It was  felt that the patient has low risk of recurrence or bleeding unless she is on Eliquis.  Her case was discussed with Dr. Vira Agar, gastroenterologist, who felt that the patient would benefit from repeated colonoscopy.  He recommended outpatient followup with him in the office on 12/12/2013 at 1:00 p.m.  The patient was advised not to take Eliquis, but to restart her aspirin therapy for now.  She is to watch her stool and she is to follow up with her primary  physician, Dr. Lamonte Sakai in the next few days after discharge.    In regards to mild bacteruria, it was questionable the patient had a possible urinary tract infection.  Cultures were taken; however, not received yet by the day of discharge.  The patient was advised to continue ciprofloxacin therapy for two more days to complete a three-day course.    For chronic medical problems such as CKD, A. Fib, hypertension, depression, hyperlipidemia, osteoarthritis, the patient is to continue outpatient management.    The patient is being discharged in stable condition with the above-mentioned medications and followup.    Her vital signs on the day of discharge, temperature was 97.5, pulse was 87, respiratory rate was 18, blood pressure 129/86, saturation 93% to 94% on room air at rest.   TIME SPENT:  40 minutes.    ____________________________ Theodoro Grist, MD rv:ea D: 12/07/2013 18:24:00 ET T: 12/08/2013 02:09:22 ET JOB#: [id number]  cc: Perrin Maltese, MD Theodoro Grist, MD, <Dictator>  [place] MD ELECTRONICALLY SIGNED 12/23/2013 18:10

## 2014-12-15 NOTE — H&P (Signed)
PATIENT NAME:  Tabitha Potts, Tabitha Potts MR#:  [id number] DATE OF BIRTH:  [birth year]  DATE OF ADMISSION:  02/18/2014  REFERRING PHYSICIAN: Dr. Jasmine December.   PRIMARY CARE PHYSICIAN: Dr. Lamonte Sakai.   PRIMARY CARDIOLOGIST: Dr. Rockey Situ.   CHIEF COMPLAINT: Bright red blood per rectum.   HISTORY OF PRESENT ILLNESS: This is an 81 year old female with a known history of atrial fibrillation on Eliquis, hypertension, hyperlipidemia, who presents with complaints of bright red blood per rectum. Reports it has been going on for the last week. The patient was recently hospitalized in April for similar presentation. She is known to have history of hemorrhoids. Her bleeding was thought to be most likely due to her hemorrhoids, as well as being on Eliquis. The bleeding stopped after she stopped the Eliquis then. The patient was resumed recently on Eliquis, as she had another episode of bleeding outside on Eliquis, where it was stopped, then bleeding resolved, and she was resumed recently on it. The patient reports her bleeding had been minimal up for the initial four days, but reports it has recently increased over the last 24 hours, which prompted her to come to the Emergency Department. The patient's vital signs have been stable, and her hemoglobin has been stable as well. The patient had reported multiple episodes of bright red blood per rectum. The patient reports crampy abdominal pain, but her CT abdomen and pelvis was only significant for diverticulosis. The patient reports her last colonoscopy was almost seven years ago, done by Dr. Vira Agar. As well, the patient had a few blood work abnormalities, including worsening baseline chronic kidney disease from creatinine of 1.5 to 1.94, and mild hyponatremia at 124.  PAST MEDICAL HISTORY: 1.  Hypertension.  2.  Hyperlipidemia.  3.  Osteoarthritis.  4.  Hemorrhoids.  5.  Diverticulosis.  6.  Atrial fibrillation on Eliquis and Cardizem.    PAST SURGICAL HISTORY: 1.  Right hip  replacement.  2.  Right knee arthroscopy.  3.  Back surgery.  4.  Neck surgery.  5.  Foot surgery.    ALLERGIES:  1.  IODINATED RADIOGRAPHIC CONTRAST.  2.  CODEINE.    HOME MEDICATIONS: 1.  Cardizem 240 mg extended release daily.  2.  Eliquis 5 mg oral p.o. b.i.d.  3.  Xanax 0.25 mg oral at bedtime as needed.  4.  Crestor 10 mg oral at bedtime.  5.  Tylenol as needed.  6.  Benicar/hydrochlorothiazide 25/40 mg oral daily.  7.  Lasix 20 mg oral b.i.d.  8.  Multivitamin 1 tablet oral daily.    SOCIAL HISTORY: The patient does not smoke. No alcohol, no illicit drug use. Lives at home. Ambulates with a walker.   FAMILY HISTORY: Both parents died of old age.   REVIEW OF SYSTEMS:  CONSTITUTIONAL:  Denies fever, chills, weight gain, weight loss. Reports fatigue and weakness.  EYES: Denies blurry vision, double vision, inflammation.  ENT: Denies tinnitus, ear pain, hearing loss, epistaxis.  RESPIRATORY: Denies cough, wheezing, hemoptysis.  CARDIOVASCULAR: Denies chest pain, edema, palpitations.  GASTROINTESTINAL: Denies nausea, vomiting, diarrhea, hematemesis, melena. Reports cramping abdominal pain, minimal, resolved currently, and bright red blood per rectum, as well as reports constipation.  GENITOURINARY: Denies dysuria, hematuria, renal colic.  ENDOCRINE: Denies polyuria, polydipsia, heat or cold intolerance.  HEMATOLOGY: Denies anemia, easy bruising, bleeding diathesis. INTEGUMENT: Denies acne, rash, or skin lesion.  MUSCULOSKELETAL: Denies any cramps, ambulates with a walker. Denies gout.  NEUROLOGIC: Denies CVA, transient ischemic attack, tremors, vertigo.  PSYCHIATRIC: Denies any  history of depression, anxiety, insomnia.   PHYSICAL EXAMINATION: VITAL SIGNS: Temperature 98.4, pulse 70, respiratory rate 19, blood pressure 154/76, saturating 99% on room air.  GENERAL: Well-nourished female who looks comfortable in bed, in no apparent distress.  HEENT: Head atraumatic,  normocephalic. Pupils equal, reactive to light. Pink conjunctivae. Anicteric sclerae. Moist oral mucosa.  NECK: Supple. No thyromegaly. No JVD.  CHEST: Good air entry bilaterally. No wheezing, rales, rhonchi.  CARDIOVASCULAR: S1, S2 heard. No rubs, murmurs or gallops.  ABDOMEN: Soft, nontender, nondistended. Bowel sounds present. No tenderness to palpation. No rebound, no guarding.  EXTREMITIES: No edema. No clubbing. No cyanosis. Pedal and radial pulses felt.  PSYCHIATRIC: Appropriate affect. Awake, alert x3. Intact judgment and insight.  NEUROLOGIC: Cranial nerves grossly intact. Motor 5/5. No focal deficits.  SKIN: Normal skin turgor. Warm and dry.  MUSCULOSKELETAL: No joint effusion or erythema.   PERTINENT LABORATORY DATA: Glucose 103, BUN 37, creatinine 1.94, sodium 129, potassium 3.8, chloride 88, CO2 27. White blood cells 9, hemoglobin 13.6, hematocrit 41.6, platelets 243. INR 1.1. Urinalysis showing trace leukocyte esterase, but it does show only 1 white blood cell.   IMAGING: CT abdomen and pelvis without contrast showing mild sigmoid diverticulosis without inflammation and mild bilateral renal atrophy. No hydronephrosis or renal obstruction is noted.   ASSESSMENT AND PLAN: 1.  Bright red blood per rectum. This is the patient's second episode, so she will be admitted for further observation. We will monitor her hemoglobin and hematocrit every eight hours. So far, her hemoglobin seems to be stable. This is most likely related to hemorrhoidal bleed, possibly diverticulosis and due to being on Eliquis. We will hold Eliquis for the time being. We will consult gastroenterology, as this is her second episode of bleeding, and we will keep her on a clear liquid diet. 2.  Hyponatremia. This is most likely volume depletion. We will hold the patient's hydrochlorothiazide and Lasix. We will continue with intravenous normal saline.  3.  Acute on chronic renal failure. This is due to volume depletion  and dehydration. We will hold Lasix and Benicar. We will monitor closely.  4.  Hypertension. Blood pressure acceptable. Continue with Cardizem.  5.  Atrial fibrillation, currently rate controlled. We will continue with Cardizem. We will hold Eliquis for the time being.  6.  Diverticulosis with constipation. We will start the patient on laxative regimen.  7.  Deep vein thrombosis prophylaxis. Sequential compression device and TED hose. Will avoid chemical anticoagulation due to bright red blood per rectum.   CODE STATUS: Full code.   TOTAL TIME SPENT ON ADMISSION AND PATIENT CARE: 55 minutes.     ____________________________ Albertine Patricia, MD dse:cg D: 02/18/2014 00:53:13 ET T: 02/18/2014 06:21:47 ET JOB#: [id number]  cc: Albertine Patricia, MD, <Dictator> [place] Graciela Husbands MD ELECTRONICALLY SIGNED 02/18/2014 23:29

## 2014-12-15 NOTE — Discharge Summary (Signed)
PATIENT NAME:  Tabitha Potts, Tabitha Potts MR#:  [id number] DATE OF BIRTH:  09/10/1925  DATE OF ADMISSION:  02/18/2014 DATE OF DISCHARGE:  02/21/2014  ADMITTING PHYSICIAN: Phillips Climes, MD  DISCHARGING PHYSICIAN: Pernell Dupre, MD  ADMITTING DIAGNOSES: 1.  Lower gastrointestinal bleed. 2.  Acute on chronic renal failure. 3.  Hyponatremia.  DISCHARGE DIAGNOSES:  1.  Lower gastrointestinal bleed. 2.  Acute chronic kidney disease.  3.  Chronic atrial fibrillation.   PROCEDURES Colonoscopy which revealed colonic polyps and diverticulosis.   CONSULTATIONS:  1.  Gastroenterology, Dr. Vira Agar.  2.  Cardiology, Dr. Fletcher Anon.   IMAGING: CT of abdomen and pelvis showed mild sigmoid diverticulosis without inflammation.   DISCHARGE MEDICATIONS: Please refer to medical reconciliation that was completed by me, in the discharge instructions.  HISTORY AND HOSPITAL COURSE: This lady was admitted through the Emergency Room with complaints of bright red blood per rectum. Please refer to the history and physical for full details. She had additional episodes of bleeding while she was in the hospital. A gastroenterology consult was placed with Dr. Vira Agar. The patient does have a history of chronic atrial fibrillation for which she is on anticoagulation. Previously Eliquis had been discontinued. During an outpatient visit with cardiology, she was advised to increase Eliquis to 2.5 mg. However, the patient had Eliquis increased to 5 mg and subsequently presented with a GI bleed several days later. In the hospital, all anticoagulants were held. She underwent colonoscopy which revealed diverticulosis without obvious sign of diverticular bleeding. At this time, GI recommended that all anticoagulation be held and cardiology concurred with that. The patient is discharged to home in satisfactory condition, her hemoglobin remained stable, and will follow-up with her cardiologist.   DISCHARGE DIET: Low fat diet, cardiac diet.    DISCHARGE FOLLOWUP: Follow up in 1 to 2 weeks with her cardiologist, Dr. Rockey Situ, and her primary care physician, Dr. Lamonte Sakai.   DISCHARGE PROCESS TIME SPENT: 35 minutes.  ____________________________ Venetia Maxon Elijio Miles, MD sat:sb D: 03/06/2014 13:25:39 ET T: 03/06/2014 15:15:38 ET JOB#: [id number]  cc: Sheikh A. Elijio Miles, MD, <Dictator> Veverly Fells MD ELECTRONICALLY SIGNED 03/14/2014 13:22

## 2014-12-15 NOTE — Consult Note (Signed)
Pt  without further bleeding.  Need colonoscopy evaluation since none in over 7 years.  Will try Miralax 2 quarts in early morning and see if can do early afternoon.  Hgb 12, plt 213, WBC 8.8, creat 1.54, sodium 130. Mg level 1.5.  Will give magnesium iv supplement.  Electronic Signatures: Manya Silvas (MD)  (Signed on 29-Jun-15 15:50)  Authored  Last Updated: 29-Jun-15 15:50 by Manya Silvas (MD)

## 2014-12-15 NOTE — Consult Note (Signed)
PATIENT NAME:  Tabitha Potts, Tabitha Potts MR#:  [id number] DATE OF BIRTH:  1925/11/16  DATE OF CONSULTATION:  02/18/2014  REFERRING PHYSICIAN:   CONSULTING PHYSICIAN:  Manya Silvas, MD  HISTORY OF PRESENT ILLNESS: The patient is an 81 year old white female with a history of atrial fibrillation on Eliquis. She has had 3 episodes of rectal bleeding, enough to cause her to seek medical attention. She was hospitalized in April for this problem. She was going to be set up for an outpatient colonoscopy as her last colonoscopy was 7 years ago; that follow up did not take place.   She denies any iron pills. She only sees bright red blood on the paper. She was diagnosed with atrial fibrillation in October 2014. She denies any strokes or heart attacks.   PAST MEDICAL HISTORY: 1. Hypertension.  2. Hyperlipidemia.  3. Hemorrhoids.  4. Osteoarthritis.  5. Diverticulosis.  6. Atrial fibrillation.   PAST SURGICAL HISTORY: Right hip replacement, right knee  arthroscopy, back surgery, neck surgery, foot surgery.   ALLERGIES: IV CONTRAST AND CODEINE.   MEDICATIONS ON ADMISSION: Cardizem 240 mg extended release once a day, Eliquis 5 mg b.i.d., Xanax 0.25 mg at bedtime, Crestor 10 mg at bedtime, Tylenol p.r.n., Benicar/ hydrochlorothiazide 25/40 one a day, Lasix 20 mg a day, multivitamins one a day.   HABITS: Does not smoke, does not drink. Does not chew tobacco. Does not dip snuff. She lives at home and ambulates with a walker.   REVIEW OF SYSTEMS: Covered by the hospitalist. Pertinent for fatigue, weakness, crampy abdominal pain, bright red blood per rectum, occasional constipation.   PHYSICAL EXAMINATION: GENERAL: Elderly white female, pleasant, alert, oriented.   VITAL SIGNS: Temperature 97.6, pulse 68, respirations 18, blood pressure 139/77, pulse oximetry 94%.  HEENT: Sclerae anicteric. Conjunctivae negative. The head is atraumatic. Tongue negative.  CHEST: Clear.  HEART: Irregular, regular rhythm.   ABDOMEN: Nontender. No hepatosplenomegaly. No masses. No bruits.  EXTREMITIES: No significant edema.   LABORATORY DATA: Glucose 103, BUN 37, creatinine 1.94, sodium 129, potassium 3.8, chloride 88, CO2 of 27. White count 9000, hemoglobin 13.6, platelet count 243,000. INR 1.1.   CT abdomen and pelvis without contrast shows mild sigmoid diverticulosis without inflammation.   ASSESSMENT: Recurrent rectal bleeding on Eliquis. Recommend that she stop this medicine and not be put back on it again. After she has been evaluated for the bleeding, if no other abnormalities are seen such as neoplasms or AVMs, would recommend putting her on a low dose aspirin since she has had 3 bleeds within the last several months.   I talked to her about doing a colonoscopy for Tuesday or Wednesday. She feels too worn out at this time to attempt doing a prep today. I will follow with you.    ____________________________ Manya Silvas, MD [phone] D: 02/18/2014 11:35:30 ET T: 02/18/2014 13:51:13 ET JOB#: [id number]  cc: Manya Silvas, MD, <Dictator> Perrin Maltese, MD Minna Merritts, MD Manya Silvas MD ELECTRONICALLY SIGNED 03/06/2014 17:42

## 2014-12-20 ENCOUNTER — Ambulatory Visit: Payer: Medicare Other | Admitting: Cardiovascular Disease

## 2014-12-23 NOTE — Op Note (Signed)
PATIENT NAME:  Tabitha Potts, NEUWIRTH MR#:  [id number] DATE OF BIRTH:  Feb 11, 1926  DATE OF PROCEDURE:  11/27/2014  PREOPERATIVE DIAGNOSIS:  Nuclear sclerotic cataract of the right eye.   POSTOPERATIVE DIAGNOSIS:  Nuclear sclerotic cataract of the right eye.   OPERATIVE PROCEDURE:  Cataract extraction by phacoemulsification with implant of intraocular lens to right eye.   SURGEON:  Birder Robson, MD.   ANESTHESIA:  1. Managed anesthesia care.  2. Topical tetracaine drops followed by 2% Xylocaine jelly applied in the preoperative holding area.   COMPLICATIONS:  None.   TECHNIQUE:   Stop and chop.  DESCRIPTION OF PROCEDURE:  The patient was examined and consented in the preoperative holding area where the aforementioned topical anesthesia was applied to the right eye and then brought back to the Operating Room where the right eye was prepped and draped in the usual sterile ophthalmic fashion and a lid speculum was placed. A paracentesis was created with the side port blade and the anterior chamber was filled with viscoelastic. A near clear corneal incision was performed with the steel keratome. A continuous curvilinear capsulorrhexis was performed with a cystotome followed by the capsulorrhexis forceps. Hydrodissection and hydrodelineation were carried out with BSS on a blunt cannula. The lens was removed in a stop and chop technique and the remaining cortical material was removed with the irrigation-aspiration handpiece. The capsular bag was inflated with viscoelastic and the Tecnis ZCB00 21.0-diopter lens, serial number [id number] was placed in the capsular bag without complication. The remaining viscoelastic was removed from the eye with the irrigation-aspiration handpiece. The wounds were hydrated. The anterior chamber was flushed with Miostat and the eye was inflated to physiologic pressure. Cefuroxime was not placed in the eye due to PENICILLIN ALLERGY. Rather, a 3:1 dilution of Vigamox was used. The  wounds were found to be water tight. The eye was dressed with Vigamox. The patient was given protective glasses to wear throughout the day and a shield with which to sleep tonight. The patient was also given drops with which to begin a drop regimen today and will follow-up with me in one day.    ____________________________ Livingston Diones. Loxley Cibrian, MD wlp:sb D: 11/27/2014 12:32:30 ET T: 11/27/2014 12:55:38 ET JOB#: [id number]  cc: Kenniya Westrich L. Rheanne Cortopassi, MD, <Dictator> Livingston Diones Windi Toro MD ELECTRONICALLY SIGNED 11/28/2014 12:36

## 2014-12-31 IMAGING — CR DG CHEST 2V
1 series · 2 of 2 positions shown · non-contrast
Comparison: none

REASON FOR EXAM: dizziness
COMMENTS:   May transport without cardiac monitor

PROCEDURE:     DXR - DXR CHEST PA (OR AP) AND LATERAL  - March 16, 2013  [DATE]
RESULT:     The lungs are clear. The cardiac silhouette and visualized bony
skeleton are unremarkable. There is elevation of the right hemidiaphragm.

[Series 2: w chest lat · 0.14mm/px · 2 of 2 slices shown]
[im 1/2]
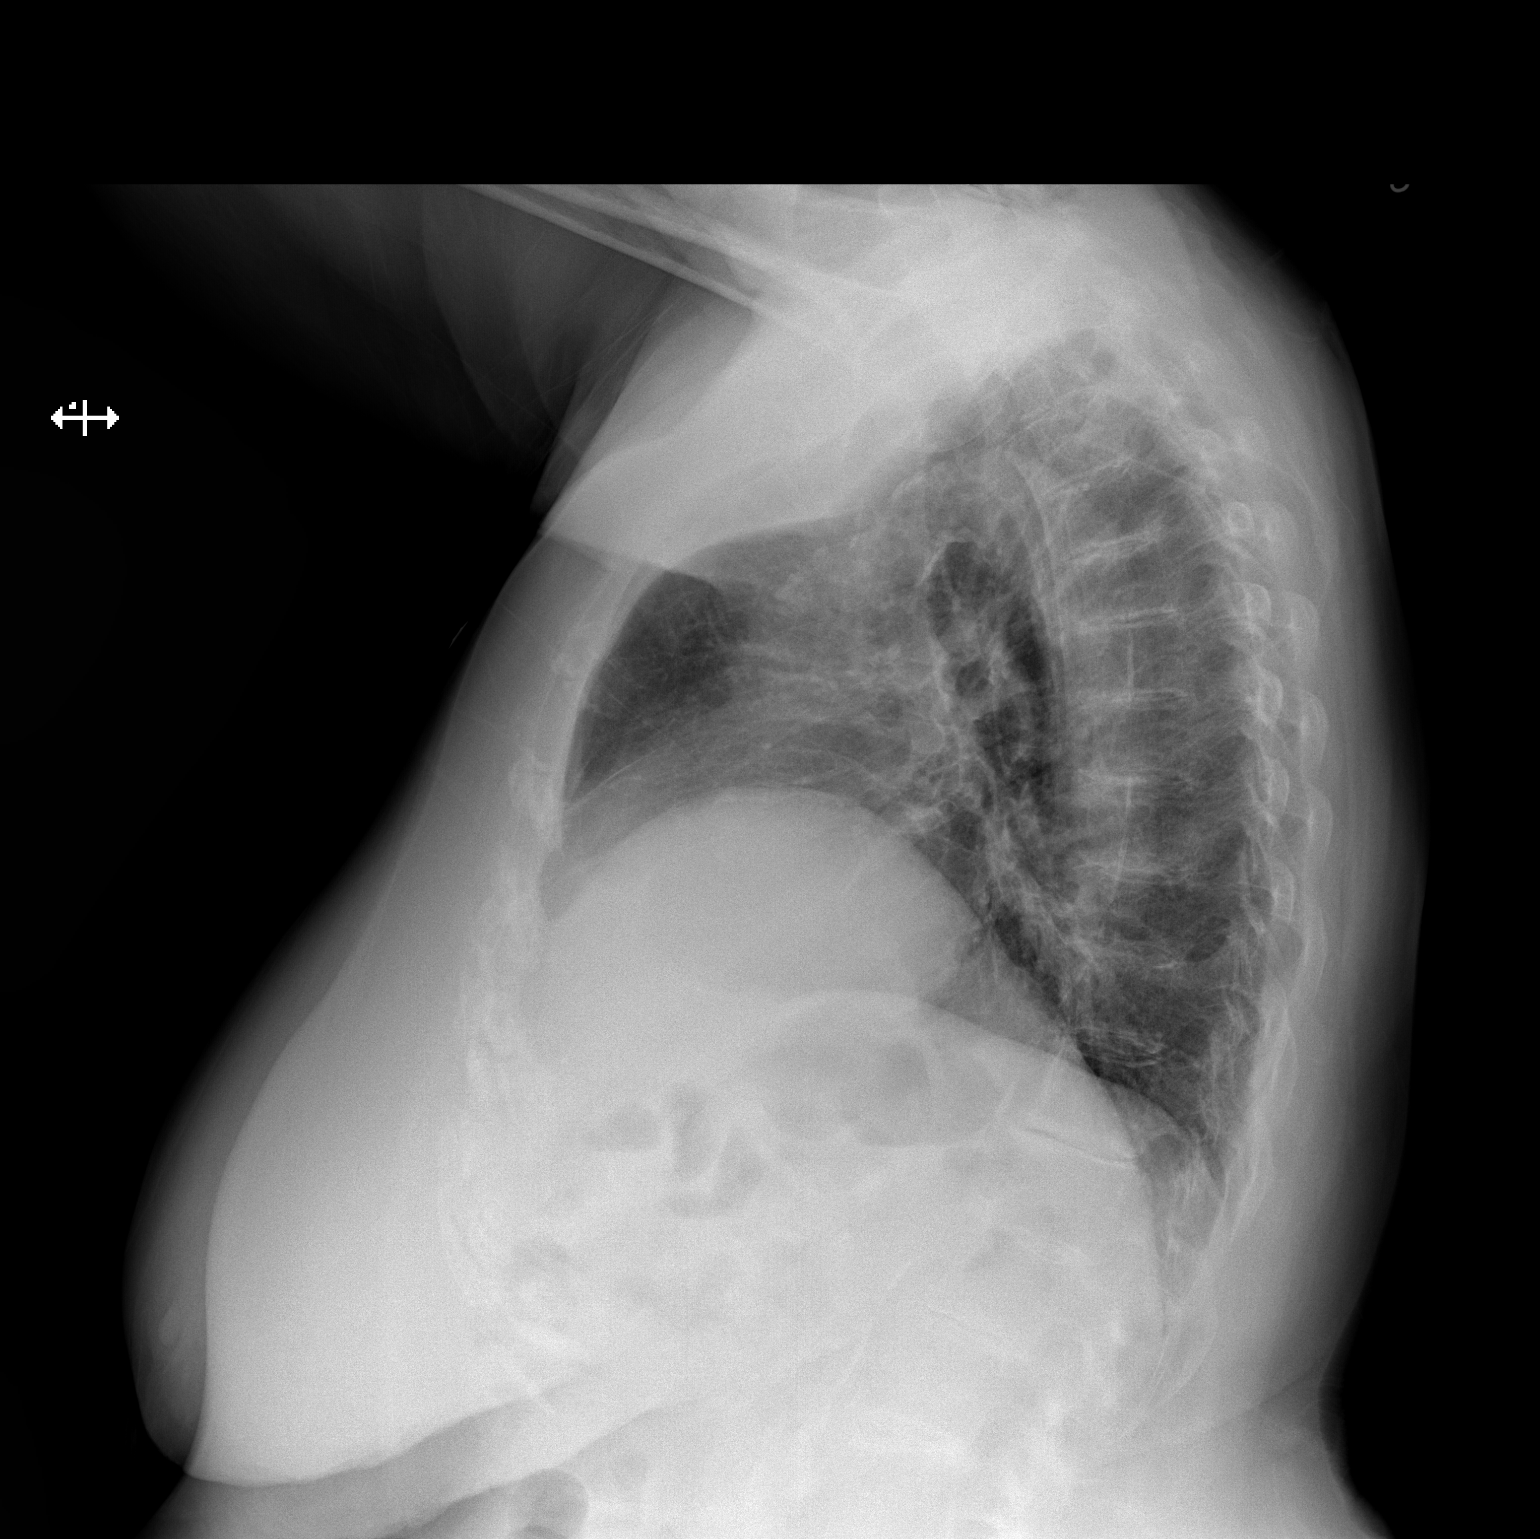
[im 2/2]
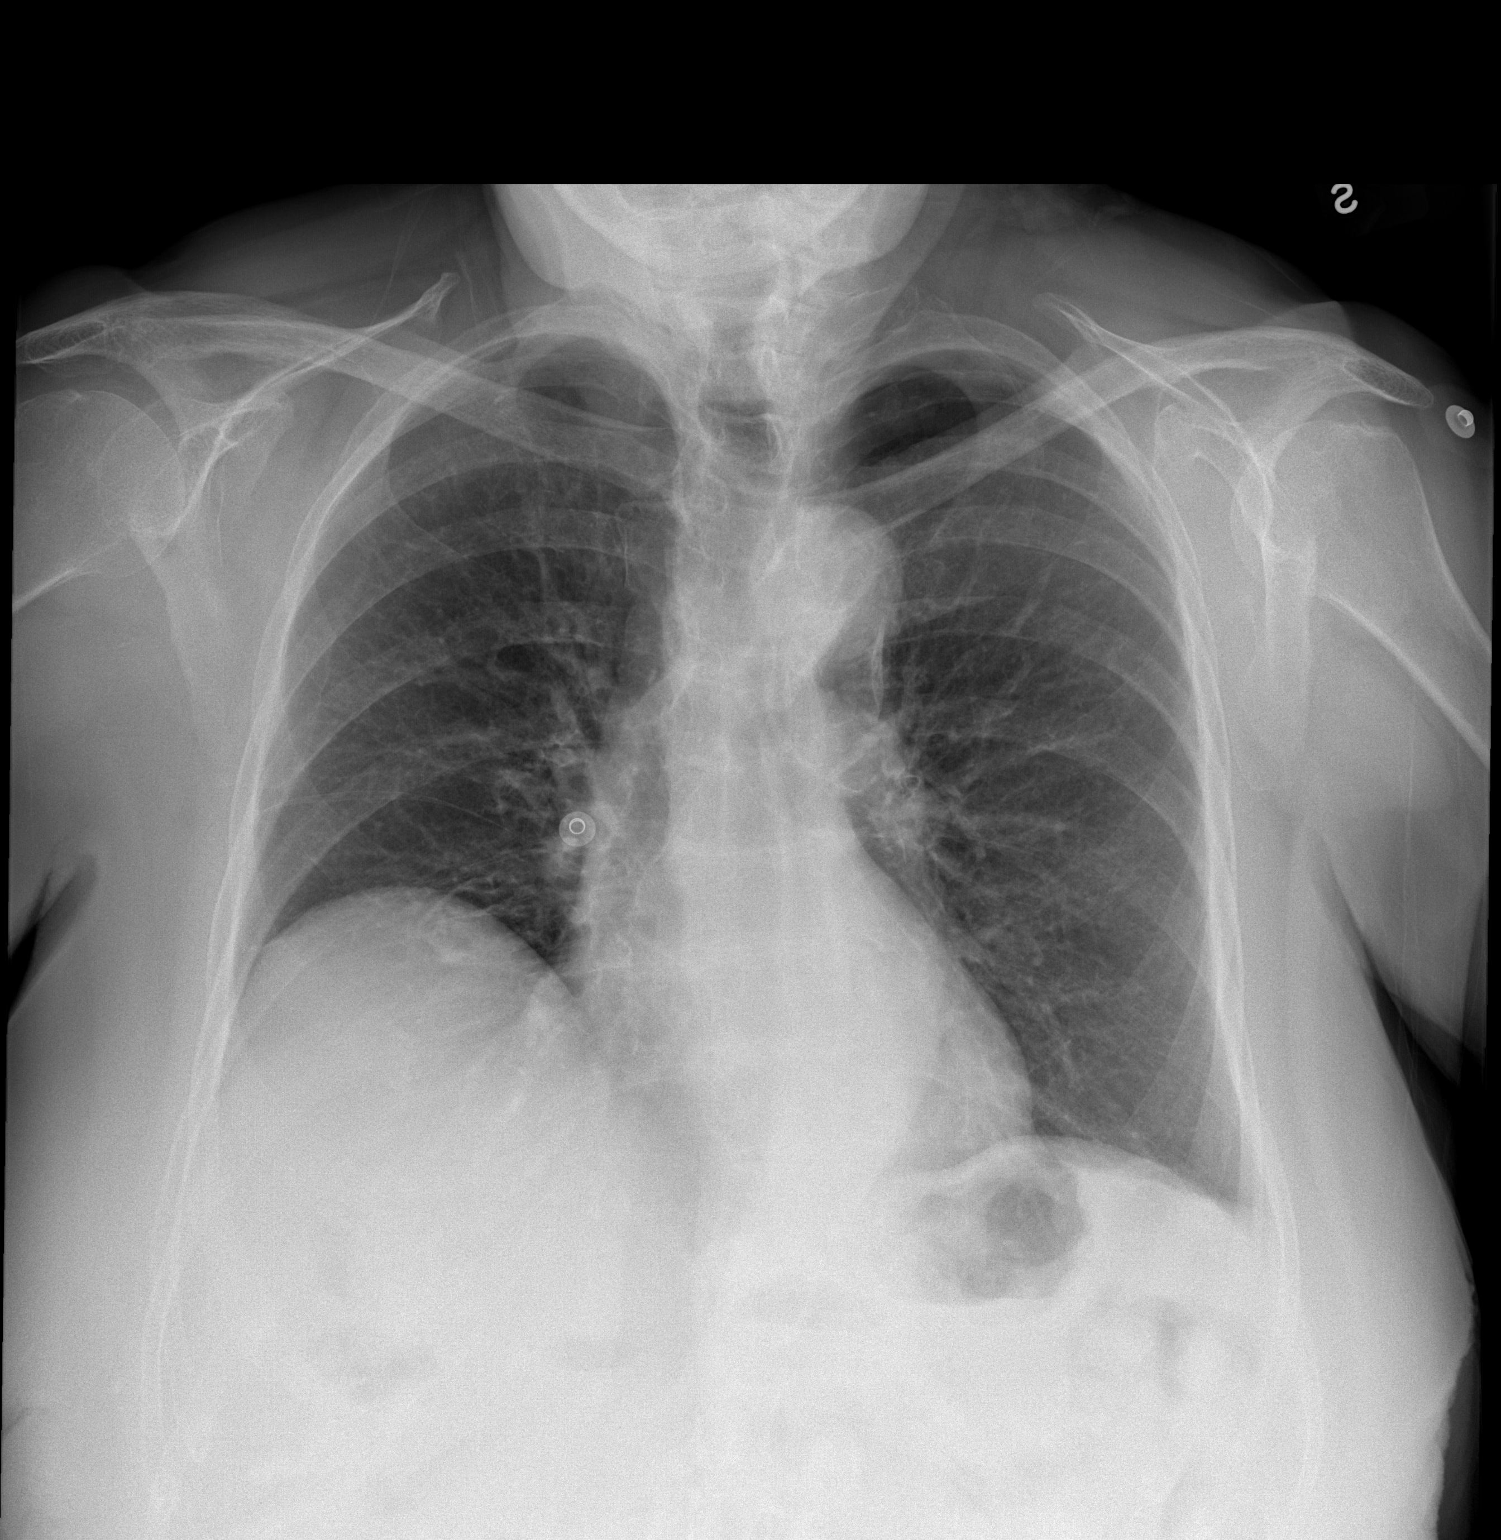

[2 of 2 positions shown; findings below may reference images not displayed]

IMPRESSION: 1. Chest radiograph without evidence of acute cardiopulmonary disease.
2. The study is compared to previous study dated 07/07/2009.

## 2015-01-01 ENCOUNTER — Encounter
Admission: RE | Admit: 2015-01-01 | Discharge: 2015-01-01 | Disposition: A | Discharge status: Home / Self Care | Payer: Medicare Other | Source: Ambulatory Visit | Attending: Ophthalmology | Admitting: Ophthalmology

## 2015-01-01 DIAGNOSIS — R2681 Unsteadiness on feet: Secondary | ICD-10-CM | POA: Insufficient documentation

## 2015-01-01 DIAGNOSIS — I5032 Chronic diastolic (congestive) heart failure: Secondary | ICD-10-CM | POA: Diagnosis not present

## 2015-01-01 DIAGNOSIS — E785 Hyperlipidemia, unspecified: Secondary | ICD-10-CM | POA: Diagnosis not present

## 2015-01-01 DIAGNOSIS — I10 Essential (primary) hypertension: Secondary | ICD-10-CM | POA: Insufficient documentation

## 2015-01-01 DIAGNOSIS — Z01812 Encounter for preprocedural laboratory examination: Secondary | ICD-10-CM | POA: Insufficient documentation

## 2015-01-01 DIAGNOSIS — K922 Gastrointestinal hemorrhage, unspecified: Secondary | ICD-10-CM | POA: Diagnosis not present

## 2015-01-01 DIAGNOSIS — H2512 Age-related nuclear cataract, left eye: Secondary | ICD-10-CM | POA: Insufficient documentation

## 2015-01-01 LAB — POTASSIUM: Potassium: 4.5 mmol/L (ref 3.5–5.1)

## 2015-01-03 ENCOUNTER — Encounter: Payer: Self-pay | Admitting: *Deleted

## 2015-01-03 DIAGNOSIS — I4891 Unspecified atrial fibrillation: Secondary | ICD-10-CM | POA: Diagnosis not present

## 2015-01-03 DIAGNOSIS — Z91041 Radiographic dye allergy status: Secondary | ICD-10-CM | POA: Diagnosis not present

## 2015-01-03 DIAGNOSIS — E780 Pure hypercholesterolemia: Secondary | ICD-10-CM | POA: Diagnosis not present

## 2015-01-03 DIAGNOSIS — Z888 Allergy status to other drugs, medicaments and biological substances status: Secondary | ICD-10-CM | POA: Diagnosis not present

## 2015-01-03 DIAGNOSIS — Z9889 Other specified postprocedural states: Secondary | ICD-10-CM | POA: Diagnosis not present

## 2015-01-03 DIAGNOSIS — Z7982 Long term (current) use of aspirin: Secondary | ICD-10-CM | POA: Diagnosis not present

## 2015-01-03 DIAGNOSIS — K219 Gastro-esophageal reflux disease without esophagitis: Secondary | ICD-10-CM | POA: Diagnosis not present

## 2015-01-03 DIAGNOSIS — I10 Essential (primary) hypertension: Secondary | ICD-10-CM | POA: Diagnosis not present

## 2015-01-03 DIAGNOSIS — Z96659 Presence of unspecified artificial knee joint: Secondary | ICD-10-CM | POA: Diagnosis not present

## 2015-01-03 DIAGNOSIS — M069 Rheumatoid arthritis, unspecified: Secondary | ICD-10-CM | POA: Diagnosis not present

## 2015-01-03 DIAGNOSIS — Z85828 Personal history of other malignant neoplasm of skin: Secondary | ICD-10-CM | POA: Diagnosis not present

## 2015-01-03 DIAGNOSIS — Z79899 Other long term (current) drug therapy: Secondary | ICD-10-CM | POA: Diagnosis not present

## 2015-01-03 DIAGNOSIS — H2512 Age-related nuclear cataract, left eye: Secondary | ICD-10-CM | POA: Diagnosis present

## 2015-01-03 DIAGNOSIS — Z885 Allergy status to narcotic agent status: Secondary | ICD-10-CM | POA: Diagnosis not present

## 2015-01-03 DIAGNOSIS — Z880 Allergy status to penicillin: Secondary | ICD-10-CM | POA: Diagnosis not present

## 2015-01-03 DIAGNOSIS — F329 Major depressive disorder, single episode, unspecified: Secondary | ICD-10-CM | POA: Diagnosis not present

## 2015-01-03 DIAGNOSIS — Z96649 Presence of unspecified artificial hip joint: Secondary | ICD-10-CM | POA: Diagnosis not present

## 2015-01-07 ENCOUNTER — Encounter: Payer: Self-pay | Admitting: Cardiovascular Disease

## 2015-01-07 ENCOUNTER — Ambulatory Visit (INDEPENDENT_AMBULATORY_CARE_PROVIDER_SITE_OTHER): Payer: Medicare Other | Admitting: Cardiovascular Disease

## 2015-01-07 VITALS — BP 116/66 | HR 66 | Ht 63.0 in | Wt 169.5 lb

## 2015-01-07 DIAGNOSIS — K5791 Diverticulosis of intestine, part unspecified, without perforation or abscess with bleeding: Secondary | ICD-10-CM | POA: Diagnosis not present

## 2015-01-07 DIAGNOSIS — I4891 Unspecified atrial fibrillation: Secondary | ICD-10-CM | POA: Diagnosis not present

## 2015-01-07 DIAGNOSIS — I5032 Chronic diastolic (congestive) heart failure: Secondary | ICD-10-CM

## 2015-01-07 DIAGNOSIS — I10 Essential (primary) hypertension: Secondary | ICD-10-CM

## 2015-01-07 DIAGNOSIS — R600 Localized edema: Secondary | ICD-10-CM

## 2015-01-07 DIAGNOSIS — E785 Hyperlipidemia, unspecified: Secondary | ICD-10-CM

## 2015-01-07 MED ORDER — PROPRANOLOL HCL 20 MG PO TABS: 20.0000 mg | ORAL_TABLET | Freq: Three times a day (TID) | ORAL | Status: AC | PRN

## 2015-01-07 MED ORDER — NITROGLYCERIN 0.4 MG SL SUBL: 0.4000 mg | SUBLINGUAL_TABLET | SUBLINGUAL | Status: AC | PRN

## 2015-01-07 NOTE — Assessment & Plan Note (Signed)
No recent GI bleed per the patient. She prefers to stay on low-dose aspirin

## 2015-01-07 NOTE — Assessment & Plan Note (Signed)
Recommended Lasix and compression hose, leg elevation

## 2015-01-07 NOTE — Assessment & Plan Note (Signed)
Blood pressure is well controlled on today's visit. No changes made to the medications. 

## 2015-01-07 NOTE — Assessment & Plan Note (Signed)
Heart rate well controlled. No medication changes made. Long discussion with family and patient concerning anticoagulation. She does not want anticoagulation at this time. "I have been doing well without it"

## 2015-01-07 NOTE — Patient Instructions (Signed)
Please take propranolol as needed for tachycardia or fast heart rate  Take nitro as needed for chest pain  Please call if you have more episodes  Please call us if you have new issues that need to be addressed before your next appt.  Your physician wants you to follow-up in: 6 months.  You will receive a reminder letter in the mail two months in advance. If you don't receive a letter, please call our office to schedule the follow-up appointment.

## 2015-01-07 NOTE — Assessment & Plan Note (Signed)
Currently on Crestor 10 mg daily

## 2015-01-07 NOTE — Assessment & Plan Note (Signed)
Recommended she take Lasix on a regular basis, consider wearing compression hose.

## 2015-01-07 NOTE — Progress Notes (Signed)
Patient ID: Tabitha Potts, female    DOB: [birth year], 81 y.o.   MRN: [id number]  HPI Comments: Tabitha Potts is a very pleasant 81 year old woman with a history of hemorrhoidal bleeding who presented to the hospital 06/09/2013 with GI bleeding felt secondary to hemorrhoids and diverticulitis, new onset atrial fibrillation with RVR. It was felt she had developed atrial fibrillation in early October 2014. Prior EKGs earlier in 2014, July, had shown normal sinus rhythm.In the hospital, She was started on Cardizem, digoxin but this was later held. Also started on anticoagulation. Norvasc and ACE inhibitor HCTZ combo was held  for low blood pressure. Creatinine in the hospital 1.47, BUN 26  She presents for routine followup of her chronic atrial fibrillation   She reports that she is doing well overall.  She reports having 2 episodes of tachycardia that woke her from sleep. One episode was worse than the other, lasting 30 minutes. She got out of bed, walked around the house, had some soda, sat in a recliner and symptoms resolved without intervention She had some slight chest discomfort with the worse of the 2 episodes No episodes during the daytime. She wonders if it was a bad dream She denies any significant chest pain with exertion during the daytime  She also reports having some recent leg swelling. She has been on her feet more, has missed Lasix. She's not wearing compression hose  EKG on today's visit shows atrial fibrillation with ventricular rate 66 bpm  Other past medical history  Not doing regular exercise She's not taking anticoagulation. Recurrent bleeding as below. Hemorrhoids have been okay This was discussed again today with daughter who is up from [place] and the patient. She does not want anticoagulation  Previously started on amiodarone in an attempt to pharmacologically cardiovert her to normal sinus rhythm. She remained in atrial fibrillation.   she underwent cardioversion on  September 28 2013 which was successful in restoring normal sinus rhythm Followup in clinic after episode of insomnia, a throbbing in her head. She was in normal sinus rhythm On her way into the clinic, she hit her foot on her walker and suffered a large hematoma.  Swelling got worse that night and she had severe pain that was 10 over 10. She went to the emergency room but was sent back home. Bruising/hematoma  resolved. Also reports having significant stress when her son-in-law was visiting. He was eating everything in the house She converted back to atrial fibrillation during these events.    April 2015, she had a recurrent hemorrhoidal/diverticuli bleed. eliquis was held at the time of her discharge from the hospital. She saw Dr. Aleen Sells   She elected to restart her eliquis 2.5 mg twice a day. She did take an occasional 5 mg  On this regimen, she had recurrent bleed . She was admitted to the hospital 02/17/2014. No significant change in her hematocrit.  Colonoscopy was performed . Uncertain if bleeding was from internal hemorrhoids or diverticuli .  She presents today and reports having continued trace bleeding which she feels is from her internal hemorrhoids. She is scheduled to see surgery for possible banding. She's not taking anything except for low-dose aspirin.  Echocardiogram in the hospital 06/09/2013 shows ejection fraction 60-65%, normal right ventricular systolic pressure, mildly dilated left atrium  Hematocrit 06/10/2013 was 35   Since stress test in 2009  Echocardiogram 04/13/2013 with same results as above   Allergies  Allergen Reactions  . Celebrex [Celecoxib]   . Codeine   .  Ivp Dye [Iodinated Diagnostic Agents]   . Penicillins     Current Outpatient Prescriptions on File Prior to Visit  Medication Sig Dispense Refill  . acetaminophen (TYLENOL) 325 MG tablet Take 650 mg by mouth every 6 (six) hours as needed.    . ALPRAZolam (XANAX) 0.25 MG tablet Take 0.25 mg by mouth  at bedtime as needed.     Marland Kitchen aspirin EC 81 MG tablet Take 1 tablet (81 mg total) by mouth daily. 90 tablet 3  . diltiazem (DILACOR XR) 240 MG 24 hr capsule Take 1 capsule (240 mg total) by mouth daily. 30 capsule 3  . furosemide (LASIX) 20 MG tablet Take 1 tablet (20 mg total) by mouth 2 (two) times daily as needed. 60 tablet 11  . meclizine (ANTIVERT) 25 MG tablet Take 25 mg by mouth 3 (three) times daily as needed.     . Multiple Vitamin (MULTIVITAMIN) tablet Take 1 tablet by mouth daily.    . NON FORMULARY Stool Softner 100 mg twice daily.    Marland Kitchen olmesartan (BENICAR) 40 MG tablet Take 40 mg by mouth daily.    . potassium chloride (K-DUR) 10 MEQ tablet Take 1 tablet (10 mEq total) by mouth daily as needed. 90 tablet 3  . PREMARIN vaginal cream Place 1 Applicatorful vaginally as needed.     . rosuvastatin (CRESTOR) 10 MG tablet Take 10 mg by mouth daily.     No current facility-administered medications on file prior to visit.    Past Medical History  Diagnosis Date  . Hypertension   . Hyperlipidemia   . Diverticulosis   . Hematochezia   . A-fib   . Hemorrhoids   . GERD (gastroesophageal reflux disease)   . Shortness of breath dyspnea   . Dysrhythmia     afib  . Syncope   . Depression   . Osteoarthritis     ra  . Cancer     history nose  . Anginal pain   . Orthopnea   . Palpitations   . Swelling   . Wheezing     Past Surgical History  Procedure Laterality Date  . Knee surgery    . Neck surgery    . Foot surgery    . Total hip arthroplasty      right  . Cardioversion  2015  . Colonoscopy  2015  . Joint replacement      thr,tkr  . Back surgery      lumbar,neck  . Abdominal hysterectomy    . Eye surgery      cataract    Social History  reports that she has never smoked. She has never used smokeless tobacco. She reports that she does not drink alcohol or use illicit drugs.  Family History family history includes Heart disease in her son; Hypertension in her  son.   Review of Systems  Constitutional: Negative.   Respiratory: Negative.   Cardiovascular: Positive for chest pain, palpitations and leg swelling.  Gastrointestinal: Negative.   Musculoskeletal: Negative.   Skin: Negative.   Neurological: Negative.   Hematological: Negative.   Psychiatric/Behavioral: Negative.   All other systems reviewed and are negative.   BP 116/66 mmHg  Pulse 66  Ht 5\' 3"  (1.6 m)  Wt 169 lb 8 oz (76.885 kg)  BMI 30.03 kg/m2   Physical Exam  Constitutional: She is oriented to person, place, and time. She appears well-developed and well-nourished.  HENT:  Head: Normocephalic.  Nose: Nose normal.  Mouth/Throat: Oropharynx is  clear and moist.  Eyes: Conjunctivae are normal. Pupils are equal, round, and reactive to light.  Neck: Normal range of motion. Neck supple. No JVD present.  Cardiovascular: Normal rate, S1 normal, S2 normal, normal heart sounds and intact distal pulses.  An irregularly irregular rhythm present. Exam reveals no gallop and no friction rub.   No murmur heard. Trace to 1+ pitting lower extremity edema  Pulmonary/Chest: Effort normal and breath sounds normal. No respiratory distress. She has no wheezes. She has no rales. She exhibits no tenderness.  Abdominal: Soft. Bowel sounds are normal. She exhibits no distension. There is no tenderness.  Musculoskeletal: Normal range of motion. She exhibits no edema or tenderness.  Lymphadenopathy:    She has no cervical adenopathy.  Neurological: She is alert and oriented to person, place, and time. Coordination normal.  Skin: Skin is warm and dry. No rash noted. No erythema.  Psychiatric: She has a normal mood and affect. Her behavior is normal. Judgment and thought content normal.    Assessment and Plan  Nursing note and vitals reviewed.

## 2015-01-08 ENCOUNTER — Encounter
Admission: RE | Disposition: A | Discharge status: Home / Self Care | Payer: Self-pay | Source: Ambulatory Visit | Attending: Ophthalmology

## 2015-01-08 ENCOUNTER — Ambulatory Visit
Admission: RE | Admit: 2015-01-08 | Discharge: 2015-01-08 | Disposition: A | Discharge status: Home / Self Care | Payer: Medicare Other | Source: Ambulatory Visit | Attending: Ophthalmology | Admitting: Ophthalmology

## 2015-01-08 ENCOUNTER — Ambulatory Visit: Payer: Medicare Other | Admitting: Certified Registered"

## 2015-01-08 ENCOUNTER — Encounter: Payer: Self-pay | Admitting: *Deleted

## 2015-01-08 DIAGNOSIS — Z85828 Personal history of other malignant neoplasm of skin: Secondary | ICD-10-CM | POA: Insufficient documentation

## 2015-01-08 DIAGNOSIS — H2512 Age-related nuclear cataract, left eye: Secondary | ICD-10-CM | POA: Diagnosis not present

## 2015-01-08 DIAGNOSIS — Z96659 Presence of unspecified artificial knee joint: Secondary | ICD-10-CM | POA: Insufficient documentation

## 2015-01-08 DIAGNOSIS — Z888 Allergy status to other drugs, medicaments and biological substances status: Secondary | ICD-10-CM | POA: Insufficient documentation

## 2015-01-08 DIAGNOSIS — M069 Rheumatoid arthritis, unspecified: Secondary | ICD-10-CM | POA: Insufficient documentation

## 2015-01-08 DIAGNOSIS — I10 Essential (primary) hypertension: Secondary | ICD-10-CM | POA: Insufficient documentation

## 2015-01-08 DIAGNOSIS — Z9889 Other specified postprocedural states: Secondary | ICD-10-CM | POA: Insufficient documentation

## 2015-01-08 DIAGNOSIS — Z885 Allergy status to narcotic agent status: Secondary | ICD-10-CM | POA: Insufficient documentation

## 2015-01-08 DIAGNOSIS — K219 Gastro-esophageal reflux disease without esophagitis: Secondary | ICD-10-CM | POA: Insufficient documentation

## 2015-01-08 DIAGNOSIS — E780 Pure hypercholesterolemia: Secondary | ICD-10-CM | POA: Insufficient documentation

## 2015-01-08 DIAGNOSIS — I4891 Unspecified atrial fibrillation: Secondary | ICD-10-CM | POA: Insufficient documentation

## 2015-01-08 DIAGNOSIS — F329 Major depressive disorder, single episode, unspecified: Secondary | ICD-10-CM | POA: Insufficient documentation

## 2015-01-08 DIAGNOSIS — Z79899 Other long term (current) drug therapy: Secondary | ICD-10-CM | POA: Insufficient documentation

## 2015-01-08 DIAGNOSIS — Z96649 Presence of unspecified artificial hip joint: Secondary | ICD-10-CM | POA: Insufficient documentation

## 2015-01-08 DIAGNOSIS — Z91041 Radiographic dye allergy status: Secondary | ICD-10-CM | POA: Insufficient documentation

## 2015-01-08 DIAGNOSIS — Z7982 Long term (current) use of aspirin: Secondary | ICD-10-CM | POA: Insufficient documentation

## 2015-01-08 DIAGNOSIS — Z880 Allergy status to penicillin: Secondary | ICD-10-CM | POA: Insufficient documentation

## 2015-01-08 HISTORY — DX: Syncope and collapse: R55

## 2015-01-08 HISTORY — PX: CATARACT EXTRACTION W/PHACO: SHX586

## 2015-01-08 HISTORY — DX: Orthopnea: R06.01

## 2015-01-08 HISTORY — DX: Palpitations: R00.2

## 2015-01-08 HISTORY — DX: Cardiac arrhythmia, unspecified: I49.9

## 2015-01-08 HISTORY — DX: Edema, unspecified: R60.9

## 2015-01-08 HISTORY — DX: Wheezing: R06.2

## 2015-01-08 HISTORY — DX: Depression, unspecified: F32.A

## 2015-01-08 HISTORY — DX: Angina pectoris, unspecified: I20.9

## 2015-01-08 HISTORY — DX: Major depressive disorder, single episode, unspecified: F32.9

## 2015-01-08 HISTORY — DX: Reserved for inherently not codable concepts without codable children: IMO0001

## 2015-01-08 SURGERY — PHACOEMULSIFICATION, CATARACT, WITH IOL INSERTION
Anesthesia: Monitor Anesthesia Care | Laterality: Left

## 2015-01-08 MED ORDER — POVIDONE-IODINE 5 % OP SOLN
1.0000 "application " | OPHTHALMIC | Status: AC | PRN
  Administered 2015-01-08: 1 via OPHTHALMIC

## 2015-01-08 MED ORDER — NA CHONDROIT SULF-NA HYALURON 40-17 MG/ML IO SOLN
INTRAOCULAR | Status: AC
  Filled 2015-01-08: qty 1

## 2015-01-08 MED ORDER — TETRACAINE HCL 0.5 % OP SOLN
OPHTHALMIC | Status: AC
  Filled 2015-01-08: qty 2

## 2015-01-08 MED ORDER — POVIDONE-IODINE 5 % OP SOLN
OPHTHALMIC | Status: AC
  Filled 2015-01-08: qty 30

## 2015-01-08 MED ORDER — MIDAZOLAM HCL 2 MG/2ML IJ SOLN
INTRAMUSCULAR | Status: DC | PRN
  Administered 2015-01-08: 1 mg via INTRAVENOUS

## 2015-01-08 MED ORDER — BSS IO SOLN
INTRAOCULAR | Status: DC | PRN
  Administered 2015-01-08: 200 mL

## 2015-01-08 MED ORDER — SODIUM CHLORIDE 0.9 % IV SOLN
INTRAVENOUS | Status: DC
Start: 2015-01-08 — End: 2015-01-08
  Administered 2015-01-08: 10:00:00 via INTRAVENOUS

## 2015-01-08 MED ORDER — MOXIFLOXACIN HCL 0.5 % OP SOLN
OPHTHALMIC | Status: AC
  Filled 2015-01-08: qty 3

## 2015-01-08 MED ORDER — LIDOCAINE HCL (PF) 1 % IJ SOLN
INTRAMUSCULAR | Status: AC
  Filled 2015-01-08: qty 2

## 2015-01-08 MED ORDER — MOXIFLOXACIN HCL 0.5 % OP SOLN - NO CHARGE
OPHTHALMIC | Status: DC | PRN
  Administered 2015-01-08: 1 [drp] via OPHTHALMIC

## 2015-01-08 MED ORDER — CEFUROXIME OPHTHALMIC INJECTION 1 MG/0.1 ML
INJECTION | OPHTHALMIC | Status: AC
  Filled 2015-01-08: qty 0.1

## 2015-01-08 MED ORDER — ARMC OPHTHALMIC DILATING GEL
1.0000 "application " | OPHTHALMIC | Status: DC | PRN
  Administered 2015-01-08: 1 via OPHTHALMIC

## 2015-01-08 MED ORDER — ACETAMINOPHEN 500 MG PO TABS
1000.0000 mg | ORAL_TABLET | Freq: Four times a day (QID) | ORAL | Status: DC | PRN
  Administered 2015-01-08: 1000 mg via ORAL

## 2015-01-08 MED ORDER — CARBACHOL 0.01 % IO SOLN
INTRAOCULAR | Status: DC | PRN
  Administered 2015-01-08: 0.5 mL via INTRAOCULAR

## 2015-01-08 MED ORDER — ACETAMINOPHEN 500 MG PO TABS
ORAL_TABLET | ORAL | Status: AC
  Filled 2015-01-08: qty 2

## 2015-01-08 MED ORDER — ARMC OPHTHALMIC DILATING GEL
OPHTHALMIC | Status: AC
  Filled 2015-01-08: qty 0.25

## 2015-01-08 MED ORDER — EPINEPHRINE HCL 1 MG/ML IJ SOLN
INTRAMUSCULAR | Status: AC
  Filled 2015-01-08: qty 1

## 2015-01-08 MED ORDER — TETRACAINE HCL 0.5 % OP SOLN
1.0000 [drp] | OPHTHALMIC | Status: AC | PRN
  Administered 2015-01-08: 1 [drp] via OPHTHALMIC

## 2015-01-08 SURGICAL SUPPLY — 23 items
ACTIVE FMS ×3 IMPLANT
CANNULA ANT/CHMB 27GA (MISCELLANEOUS) ×3 IMPLANT
GLOVE BIO SURGEON STRL SZ8 (GLOVE) ×3 IMPLANT
GLOVE BIOGEL M 6.5 STRL (GLOVE) ×3 IMPLANT
GLOVE SURG LX 8.0 MICRO (GLOVE) ×2
GLOVE SURG LX STRL 8.0 MICRO (GLOVE) ×1 IMPLANT
GOWN STRL REUS W/ TWL LRG LVL3 (GOWN DISPOSABLE) ×2 IMPLANT
GOWN STRL REUS W/TWL LRG LVL3 (GOWN DISPOSABLE) ×4
LENS IOL TECNIS 20.0 (Intraocular Lens) ×3 IMPLANT
LENS IOL TECNIS MONO 1P 20.0 (Intraocular Lens) ×1 IMPLANT
PACK CATARACT (MISCELLANEOUS) ×3 IMPLANT
PACK CATARACT BRASINGTON LX (MISCELLANEOUS) ×3 IMPLANT
PACK EYE AFTER SURG (MISCELLANEOUS) ×3 IMPLANT
SOL BSS BAG (MISCELLANEOUS) ×3
SOL PREP PVP 2OZ (MISCELLANEOUS) ×3
SOLUTION BSS BAG (MISCELLANEOUS) ×1 IMPLANT
SOLUTION PREP PVP 2OZ (MISCELLANEOUS) ×1 IMPLANT
SYR 5ML LL (SYRINGE) ×3 IMPLANT
SYR TB 1ML 27GX1/2 LL (SYRINGE) ×3 IMPLANT
WATER STERILE IRR 1000ML POUR (IV SOLUTION) ×3 IMPLANT
WIPE NON LINTING 3.25X3.25 (MISCELLANEOUS) ×3 IMPLANT
ZCB0020.0 ×3 IMPLANT
zcb0020.0 ×3 IMPLANT

## 2015-01-08 NOTE — H&P (Signed)
  All labs reviewed. Abnormal studies sent to patients PCP when indicated.  Previous H&P reviewed, patient examined, there are NO CHANGES.  

## 2015-01-08 NOTE — Transfer of Care (Signed)
Immediate Anesthesia Transfer of Care Note  Patient: Tabitha Potts  Procedure(s) Performed: Procedure(s) with comments: CATARACT EXTRACTION PHACO AND INTRAOCULAR LENS PLACEMENT (IOC) (Left) - [place] 01:02 AP% 21.8 CDE 13.69  Patient Location: PACU  Anesthesia Type:MAC  Level of Consciousness: awake, alert , oriented and patient cooperative  Airway & Oxygen Therapy: Patient Spontanous Breathing  Post-op Assessment: Report given to RN, Post -op Vital signs reviewed and stable and Patient moving all extremities X 4  Post vital signs: Reviewed and stable  Last Vitals:  Filed Vitals:   01/08/15 1024  BP: 162/84  Pulse: 68  Temp: 36 C  Resp: 18    Complications: No apparent anesthesia complications

## 2015-01-08 NOTE — Discharge Instructions (Addendum)
See cataract post op handout Eye Surgery Discharge Instructions  Expect mild scratchy sensation or mild soreness. DO NOT RUB YOUR EYE!  The day of surgery:  Minimal physical activity, but bed rest is not required  No reading, computer work, or close hand work  No bending, lifting, or straining.  May watch TV  For 24 hours:  No driving, legal decisions, or alcoholic beverages  Safety precautions  Eat anything you prefer: It is better to start with liquids, then soup then solid foods.  _____ Eye patch should be worn until postoperative exam tomorrow.  ____ Solar shield eyeglasses should be worn for comfort in the sunlight/patch while sleeping  Resume all regular medications including aspirin or Coumadin if these were discontinued prior to surgery. You may shower, bathe, shave, or wash your hair. Tylenol may be taken for mild discomfort.  Call your doctor if you experience significant pain, nausea, or vomiting, fever > 101 or other signs of infection. [phone] or [phone] Specific instructions:

## 2015-01-08 NOTE — Op Note (Signed)
PREOPERATIVE DIAGNOSIS:  Nuclear sclerotic cataract of the left eye.   POSTOPERATIVE DIAGNOSIS:  Nuclear sclerotic cataract of the left eye.   OPERATIVE PROCEDURE: Procedure(s): CATARACT EXTRACTION PHACO AND INTRAOCULAR LENS PLACEMENT (IOC)   SURGEON:  Birder Robson, MD.   ANESTHESIA: 1.      Managed anesthesia care. 2.      Topical tetracaine drops followed by 2% Xylocaine jelly applied in the preoperative holding area.   COMPLICATIONS:  None.   TECHNIQUE:   Stop and chop   DESCRIPTION OF PROCEDURE:  The patient was examined and consented in the preoperative holding area where the aforementioned topical anesthesia was applied to the left eye and then brought back to the Operating Room where the left eye was prepped and draped in the usual sterile ophthalmic fashion and a lid speculum was placed. A paracentesis was created with the side port blade and the anterior chamber was filled with viscoelastic. A near clear corneal incision was performed with the steel keratome. A continuous curvilinear capsulorrhexis was performed with a cystotome followed by the capsulorrhexis forceps. Hydrodissection and hydrodelineation were carried out with BSS on a blunt cannula. The lens was removed in a stop and chop  technique and the remaining cortical material was removed with the irrigation-aspiration handpiece. The capsular bag was inflated with viscoelastic and the Technis ZCB00 20.0 d lens # [id number]  was placed in the capsular bag without complication. The remaining viscoelastic was removed from the eye with the irrigation-aspiration handpiece. The wounds were hydrated. The anterior chamber was flushed with Miostat and the eye was inflated to physiologic pressure. 0.2 mL of Vigamox diluted three/one with BSS was placed in the anterior chamber. The wounds were found to be water tight. The eye was dressed with Vigamox. The patient was given protective glasses to wear throughout the day and a shield with  which to sleep tonight. The patient was also given drops with which to begin a drop regimen today and will follow-up with me in one day.   Electronically signed: Tim Lair 01/08/2015 10:25 AM

## 2015-01-08 NOTE — Anesthesia Preprocedure Evaluation (Signed)
Anesthesia Evaluation  Patient identified by MRN, date of birth, ID band Patient awake    Reviewed: Allergy & Precautions, H&P , NPO status , Patient's Chart, lab work & pertinent test results, reviewed documented beta blocker date and time   Airway Mallampati: II  TM Distance: >3 FB Neck ROM: full    Dental no notable dental hx.    Pulmonary neg pulmonary ROS, shortness of breath and with exertion,  Comfortable today  breath sounds clear to auscultation  Pulmonary exam normal       Cardiovascular Exercise Tolerance: Good hypertension, + angina +CHF and + Orthopnea negative cardio ROS  + dysrhythmias Rhythm:regular Rate:Normal     Neuro/Psych negative neurological ROS  negative psych ROS   GI/Hepatic negative GI ROS, Neg liver ROS, GERD-  ,  Endo/Other  negative endocrine ROS  Renal/GU negative Renal ROS  negative genitourinary   Musculoskeletal   Abdominal   Peds  Hematology negative hematology ROS (+)   Anesthesia Other Findings   Reproductive/Obstetrics negative OB ROS                             Anesthesia Physical Anesthesia Plan  ASA: III  Anesthesia Plan: MAC   Post-op Pain Management:    Induction:   Airway Management Planned:   Additional Equipment:   Intra-op Plan:   Post-operative Plan:   Informed Consent: I have reviewed the patients History and Physical, chart, labs and discussed the procedure including the risks, benefits and alternatives for the proposed anesthesia with the patient or authorized representative who has indicated his/her understanding and acceptance.   Dental Advisory Given  Plan Discussed with: CRNA  Anesthesia Plan Comments:         Anesthesia Quick Evaluation

## 2015-01-08 NOTE — Anesthesia Postprocedure Evaluation (Signed)
  Anesthesia Post-op Note  Patient: Tabitha Potts  Procedure(s) Performed: Procedure(s) with comments: CATARACT EXTRACTION PHACO AND INTRAOCULAR LENS PLACEMENT (IOC) (Left) - [place] 01:02 AP% 21.8 CDE 13.69  Anesthesia type:MAC  Patient location: PACU  Post pain: Pain level controlled  Post assessment: Post-op Vital signs reviewed, Patient's Cardiovascular Status Stable, Respiratory Function Stable, Patent Airway and No signs of Nausea or vomiting  Post vital signs: Reviewed and stable  Last Vitals:  Filed Vitals:   01/08/15 1024  BP: 162/84  Pulse: 68  Temp: 36 C  Resp: 18    Level of consciousness: awake, alert  and patient cooperative  Complications: No apparent anesthesia complications

## 2015-01-09 ENCOUNTER — Encounter: Payer: Self-pay | Admitting: Ophthalmology

## 2015-01-24 ENCOUNTER — Telehealth: Payer: Self-pay | Admitting: *Deleted

## 2015-01-24 NOTE — Telephone Encounter (Signed)
Pt c/o medication issue:  1. Name of Medication: Diltizan  2. How are you currently taking this medication (dosage and times per day)? Yes  3. Are you having a reaction (difficulty breathing--STAT)? No  4. What is your medication issue?used to take this medication without the HCL now when she went for a refill it is with it Just wants to know why this is and if it okay for her to take this Spoke to pharmacist and he told her it is the same  But she would like to make sure.  Please advise.

## 2015-01-24 NOTE — Telephone Encounter (Signed)
Spoke w/ pt.  She states that pharmacy advised her that HCL is a filler in the diltiazem and will not hurt her.  Advised her that this was listed on all of the diltiazems that I have to choose from in the rx list. She states that she just wanted to make sure it was safe to take and that a mistake was not made at the pharmacy.

## 2015-01-24 NOTE — Telephone Encounter (Signed)
Pt c/o medication issue:  1. Name of Medication: Diltizan   2. How are you currently taking this medication (dosage and times per day)? Yes   3. Are you having a reaction (difficulty breathing--STAT)? No   4. What is your medication issue? Stated a new medication and without HCL now it is with HCL  Just wants to know why this is.

## 2015-02-18 ENCOUNTER — Other Ambulatory Visit: Payer: Self-pay | Admitting: Cardiovascular Disease

## 2015-03-04 ENCOUNTER — Encounter: Payer: Self-pay | Admitting: Ophthalmology

## 2015-03-18 LAB — POTASSIUM: Potassium: 4.5 mmol/L

## 2015-06-24 ENCOUNTER — Telehealth: Payer: Self-pay | Admitting: Cardiovascular Disease

## 2015-06-24 NOTE — Telephone Encounter (Signed)
° ° ° °  1. Are you currently SOB (can you hear that pt is SOB on the phone)?   Yes  2. How long have you been experiencing SOB?  All symtoms started Saturday   3. Are you SOB when sitting or when up moving around?  Exertion, eases when resting   4. Are you currently experiencing any other symptoms? Patient thinks she may be in Afib .  Has hx.   Patient says she has pinching pain in left breast, L arm pain, neck pain.    Wants sooner appt but hesitates to see PA.   Son is transport and he lives 1  1/2 hrs away.

## 2015-06-24 NOTE — Telephone Encounter (Signed)
Spoke w/ pt.  She reports that she has been symptomatic since Saturday w/ SOB, pain in her arm and neck. She has not checked HR or BP, has not taken propranolol. She states that after leaving message w/ our office, she went outside for some fresh air and immediately felt better. Advised her to continue to monitor and if sx return, to call the office and she can come in for an EKG. Pt had appt on 11/14, but she does request an earlier appt.  Pt resched to 07/02/15 but will call back w/ any questions or concerns before that time.

## 2015-07-02 ENCOUNTER — Ambulatory Visit (INDEPENDENT_AMBULATORY_CARE_PROVIDER_SITE_OTHER): Payer: Medicare Other | Admitting: Cardiovascular Disease

## 2015-07-02 ENCOUNTER — Encounter: Payer: Self-pay | Admitting: Cardiovascular Disease

## 2015-07-02 VITALS — BP 147/77 | HR 70 | Ht 64.0 in | Wt 169.5 lb

## 2015-07-02 DIAGNOSIS — R0602 Shortness of breath: Secondary | ICD-10-CM

## 2015-07-02 DIAGNOSIS — Z23 Encounter for immunization: Secondary | ICD-10-CM | POA: Diagnosis not present

## 2015-07-02 DIAGNOSIS — R109 Unspecified abdominal pain: Secondary | ICD-10-CM

## 2015-07-02 DIAGNOSIS — K5791 Diverticulosis of intestine, part unspecified, without perforation or abscess with bleeding: Secondary | ICD-10-CM

## 2015-07-02 DIAGNOSIS — R600 Localized edema: Secondary | ICD-10-CM

## 2015-07-02 DIAGNOSIS — I10 Essential (primary) hypertension: Secondary | ICD-10-CM

## 2015-07-02 DIAGNOSIS — I4891 Unspecified atrial fibrillation: Secondary | ICD-10-CM

## 2015-07-02 DIAGNOSIS — I5032 Chronic diastolic (congestive) heart failure: Secondary | ICD-10-CM

## 2015-07-02 NOTE — Assessment & Plan Note (Signed)
Currently with no bleeding Again we will discuss anticoagulation on each of her visits in the future She has not wanted anticoagulation in the past given her history of GI bleed

## 2015-07-02 NOTE — Assessment & Plan Note (Signed)
Trace ankle and shin edema, continue Lasix with potassium

## 2015-07-02 NOTE — Assessment & Plan Note (Signed)
Trace edema on today's visit. She is taking Lasix every other day She may start drinking carbonated drinks for her abdominal discomfort, Suggested she may need Lasix daily for worsening ankle edema

## 2015-07-02 NOTE — Progress Notes (Signed)
Patient ID: Tabitha Potts, female    DOB: [birth year], 81 y.o.   MRN: [id number]  HPI Comments: Tabitha Potts is a very pleasant 81 year old woman with a history of hemorrhoidal bleeding who presented to the hospital 06/09/2013 with GI bleeding felt secondary to hemorrhoids and diverticulitis, new onset atrial fibrillation with RVR. It was felt she had developed atrial fibrillation in early October 2014. Prior EKGs earlier in 2014, July, had shown normal sinus rhythm.In the hospital, She was started on Cardizem, digoxin but this was later held. Also started on anticoagulation. Norvasc and ACE inhibitor HCTZ combo was held  for low blood pressure. Creatinine in the hospital 1.47, BUN 26  She presents for routine followup of her chronic atrial fibrillation  In follow-up today, she reports that last week she developed severe upper epigastric pain She ate some vegetables around 4 or 4:30 Then ate some Jell-O with fruit Very shortly after developed severe upper epigastric pain She was unable to belch. Pain was severe she called 911 She reports that pain stayed in her subxiphoid region, radiated around the right side to her back, bottom part of her ribs She drinks some ginger ale with a huge burp, continue to do small burps Eventually pain resolved after 30 minutes EMTs state with her, EKG was normal She did take some Rolaids/Tums, Mylanta Never had symptoms like this in the past Takes Lasix approximately every other day for trace ankle swelling Not wearing her hose  She does report having occasional episodes of tachycardia. She has been scared to take her propranolol Some tachycardia when she gets out of bed to go to the bathroom then goes back to bed, symptoms resolved relatively quickly  She denies any chest pain, shortness of breath on exertion, no arm pain on exertion   EKG on today's visit shows atrial fibrillation with ventricular rate 70 bpm,  Other past medical history   Not doing regular  exercise She's not taking anticoagulation. Recurrent bleeding as below. Hemorrhoids have been okay This was discussed again today with daughter who is up from [place] and the patient. She does not want anticoagulation  Previously started on amiodarone in an attempt to pharmacologically cardiovert her to normal sinus rhythm. She remained in atrial fibrillation.   she underwent cardioversion on September 28 2013 which was successful in restoring normal sinus rhythm Followup in clinic after episode of insomnia, a throbbing in her head. She was in normal sinus rhythm On her way into the clinic, she hit her foot on her walker and suffered a large hematoma.  Swelling got worse that night and she had severe pain that was 10 over 10. She went to the emergency room but was sent back home. Bruising/hematoma  resolved. Also reports having significant stress when her son-in-law was visiting. He was eating everything in the house She converted back to atrial fibrillation during these events.    April 2015, she had a recurrent hemorrhoidal/diverticuli bleed. eliquis was held at the time of her discharge from the hospital. She saw Dr. Aleen Sells   She elected to restart her eliquis 2.5 mg twice a day. She did take an occasional 5 mg  On this regimen, she had recurrent bleed . She was admitted to the hospital 02/17/2014. No significant change in her hematocrit.  Colonoscopy was performed . Uncertain if bleeding was from internal hemorrhoids or diverticuli .  She presents today and reports having continued trace bleeding which she feels is from her internal hemorrhoids. She is scheduled  to see surgery for possible banding. She's not taking anything except for low-dose aspirin.  Echocardiogram in the hospital 06/09/2013 shows ejection fraction 60-65%, normal right ventricular systolic pressure, mildly dilated left atrium  Hematocrit 06/10/2013 was 35   Since stress test in 2009  Echocardiogram 04/13/2013 with same  results as above   Allergies  Allergen Reactions  . Celebrex [Celecoxib]   . Codeine   . Ivp Dye [Iodinated Diagnostic Agents]   . Penicillins     Current Outpatient Prescriptions on File Prior to Visit  Medication Sig Dispense Refill  . acetaminophen (TYLENOL) 325 MG tablet Take 650 mg by mouth every 6 (six) hours as needed.    . ALPRAZolam (XANAX) 0.25 MG tablet Take 0.25 mg by mouth at bedtime as needed.     Marland Kitchen aspirin EC 81 MG tablet Take 1 tablet (81 mg total) by mouth daily. 90 tablet 3  . diltiazem (DILACOR XR) 240 MG 24 hr capsule TAKE ONE (1) CAPSULE EACH DAY. 30 capsule 6  . furosemide (LASIX) 20 MG tablet Take 1 tablet (20 mg total) by mouth 2 (two) times daily as needed. 60 tablet 11  . Multiple Vitamin (MULTIVITAMIN) tablet Take 1 tablet by mouth daily.    . nitroGLYCERIN (NITROSTAT) 0.4 MG SL tablet Place 1 tablet (0.4 mg total) under the tongue every 5 (five) minutes as needed for chest pain. 25 tablet 3  . olmesartan (BENICAR) 40 MG tablet Take 40 mg by mouth daily.    . potassium chloride (K-DUR) 10 MEQ tablet Take 1 tablet (10 mEq total) by mouth daily as needed. 90 tablet 3  . PREMARIN vaginal cream Place 1 Applicatorful vaginally as needed.     . propranolol (INDERAL) 20 MG tablet Take 1 tablet (20 mg total) by mouth 3 (three) times daily as needed. 90 tablet 1  . rosuvastatin (CRESTOR) 10 MG tablet Take 10 mg by mouth daily.    . meclizine (ANTIVERT) 25 MG tablet Take 25 mg by mouth 3 (three) times daily as needed.      No current facility-administered medications on file prior to visit.    Past Medical History  Diagnosis Date  . Hypertension   . Hyperlipidemia   . Diverticulosis   . Hematochezia   . A-fib ([place])   . Hemorrhoids   . GERD (gastroesophageal reflux disease)   . Shortness of breath dyspnea   . Dysrhythmia     afib  . Syncope   . Depression   . Osteoarthritis     ra  . Cancer ([place])     history nose  . Anginal pain ([place])   . Orthopnea   .  Palpitations   . Swelling   . Wheezing     Past Surgical History  Procedure Laterality Date  . Knee surgery    . Neck surgery    . Foot surgery    . Total hip arthroplasty      right  . Cardioversion  2015  . Colonoscopy  2015  . Joint replacement      thr,tkr  . Back surgery      lumbar,neck  . Abdominal hysterectomy    . Eye surgery      cataract  . Cataract extraction w/phaco Left 01/08/2015    Procedure: CATARACT EXTRACTION PHACO AND INTRAOCULAR LENS PLACEMENT (IOC);  Surgeon: Birder Robson, MD;  Location: [facility] ORS;  Service: Ophthalmology;  Laterality: Left;  [place] 01:02 AP% 21.8 CDE 13.69    Social History  reports that she has never smoked. She has never used smokeless tobacco. She reports that she does not drink alcohol or use illicit drugs.  Family History family history includes Heart disease in her son; Hypertension in her son.   Review of Systems  Constitutional: Negative.   Respiratory: Negative.   Cardiovascular: Positive for palpitations and leg swelling.  Gastrointestinal: Positive for abdominal pain.  Musculoskeletal: Negative.   Skin: Negative.   Neurological: Negative.   Hematological: Negative.   Psychiatric/Behavioral: Negative.   All other systems reviewed and are negative.   BP 147/77 mmHg  Pulse 70  Ht 5\' 4"  (1.626 m)  Wt 169 lb 8 oz (76.885 kg)  BMI 29.08 kg/m2   Physical Exam  Constitutional: She is oriented to person, place, and time. She appears well-developed and well-nourished.  HENT:  Head: Normocephalic.  Nose: Nose normal.  Mouth/Throat: Oropharynx is clear and moist.  Eyes: Conjunctivae are normal. Pupils are equal, round, and reactive to light.  Neck: Normal range of motion. Neck supple. No JVD present.  Cardiovascular: Normal rate, S1 normal, S2 normal, normal heart sounds and intact distal pulses.  An irregularly irregular rhythm present. Exam reveals no gallop and no friction rub.   No murmur heard. Trace to 1+  pitting lower extremity edema  Pulmonary/Chest: Effort normal and breath sounds normal. No respiratory distress. She has no wheezes. She has no rales. She exhibits no tenderness.  Abdominal: Soft. Bowel sounds are normal. She exhibits no distension. There is no tenderness.  Musculoskeletal: Normal range of motion. She exhibits no edema or tenderness.  Lymphadenopathy:    She has no cervical adenopathy.  Neurological: She is alert and oriented to person, place, and time. Coordination normal.  Skin: Skin is warm and dry. No rash noted. No erythema.  Psychiatric: She has a normal mood and affect. Her behavior is normal. Judgment and thought content normal.    Assessment and Plan  Nursing note and vitals reviewed.

## 2015-07-02 NOTE — Patient Instructions (Signed)
You are doing well. No medication changes were made.  For stomach: Consider Gas-x (simethicone), soda  For acid: omeprazole/nexium (take daily) As needed pills : tums (take a couple)  with pepcid/zantac (1 to 2 )  If you more pain, we could order an ultrasound for gallstones  Please call us if you have new issues that need to be addressed before your next appt.  Your physician wants you to follow-up in: 6 months.  You will receive a reminder letter in the mail two months in advance. If you don't receive a letter, please call our office to schedule the follow-up appointment.

## 2015-07-02 NOTE — Assessment & Plan Note (Signed)
Blood pressure is well controlled on today's visit. No changes made to the medications. 

## 2015-07-02 NOTE — Assessment & Plan Note (Signed)
Abdominal pain of unclear etiology, improved after belching Unable to exclude gas pain from her vegetables Differential diagnosis also includes hiatal hernia Suggested she try a proton pump inhibitor, Gas-X/simethicone If she has recurrent symptoms, right upper quadrant ultrasound could be ordered to rule out gallstones Less likely cardiac

## 2015-07-02 NOTE — Assessment & Plan Note (Signed)
Persistent atrial fibrillation, asymptomatic Prior history of bleeding. In the past we have discussed anticoagulation in detail. She did not want anything more than aspirin We will discuss this with her again in follow-up

## 2015-07-05 ENCOUNTER — Other Ambulatory Visit: Payer: Self-pay

## 2015-07-08 ENCOUNTER — Ambulatory Visit: Payer: Medicare Other | Admitting: Cardiovascular Disease

## 2015-07-10 NOTE — Telephone Encounter (Signed)
Can you review and let me know if you would like to continue refilling her meclizine? It appears that she has been getting this meclizine since 2015 and was given while at the hospital. Please advise.

## 2015-08-13 ENCOUNTER — Other Ambulatory Visit: Payer: Self-pay | Admitting: Cardiovascular Disease

## 2015-11-09 ENCOUNTER — Other Ambulatory Visit: Payer: Self-pay | Admitting: Cardiovascular Disease

## 2015-12-04 IMAGING — CT CT ABD-PELV W/O CM
2 of 4 series · 16 of 46 positions shown, 18 images · non-contrast
Comparison: None.

CLINICAL DATA: Lower abdominal pain, rectal bleeding.

EXAM:
CT ABDOMEN AND PELVIS WITHOUT CONTRAST
TECHNIQUE: Multidetector CT imaging of the abdomen and pelvis was performed
following the standard protocol without IV contrast.

[Series 2: routine abd pel without · axial · non-contrast · 0.75mm/px · z∈[-408,+2]mm · 13 of 90 slices shown, 15 images]
[im 4/90  soft-tissue]
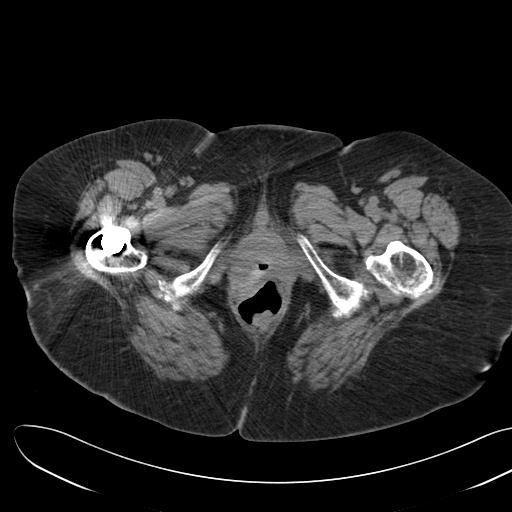
[im 4/90  bone]
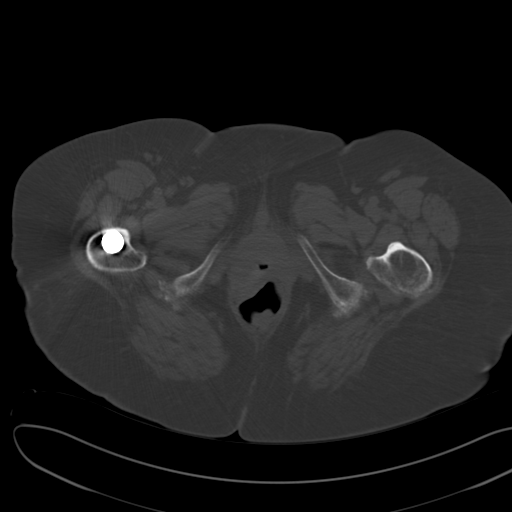
[im 12/90  soft-tissue]
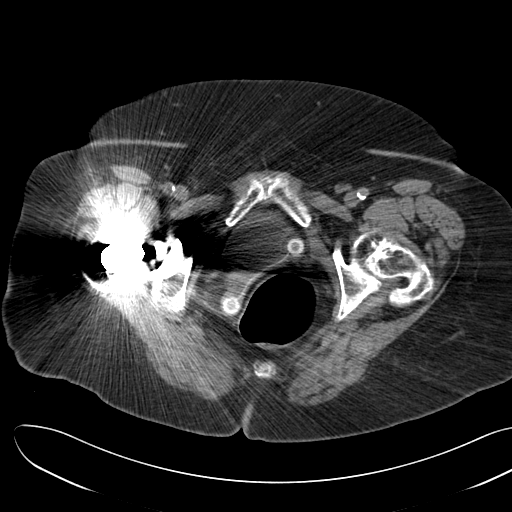
[im 20/90  soft-tissue]
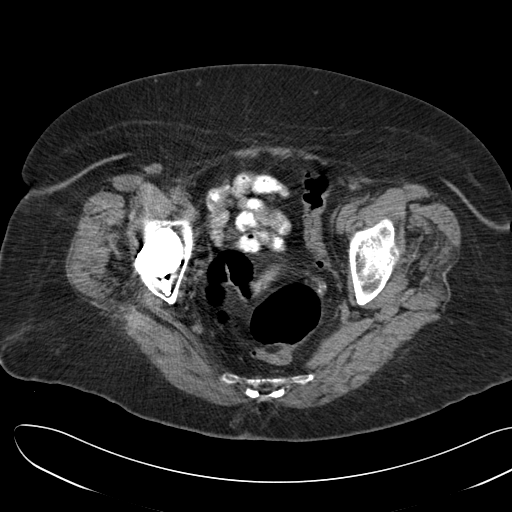
[im 24/90  soft-tissue]
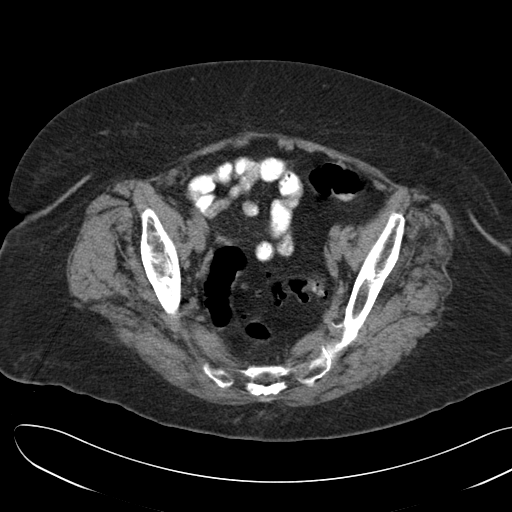
[im 31/90  soft-tissue]
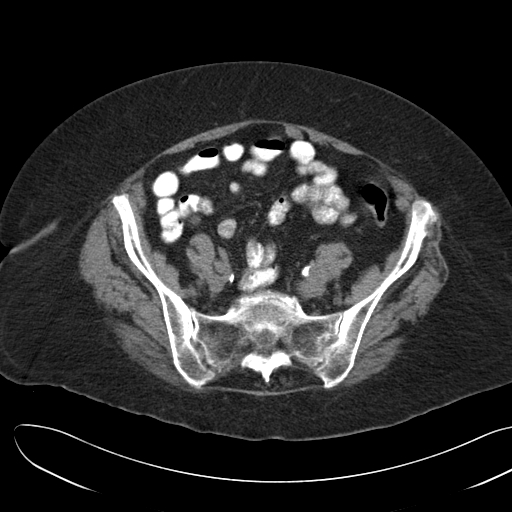
[im 39/90  soft-tissue]
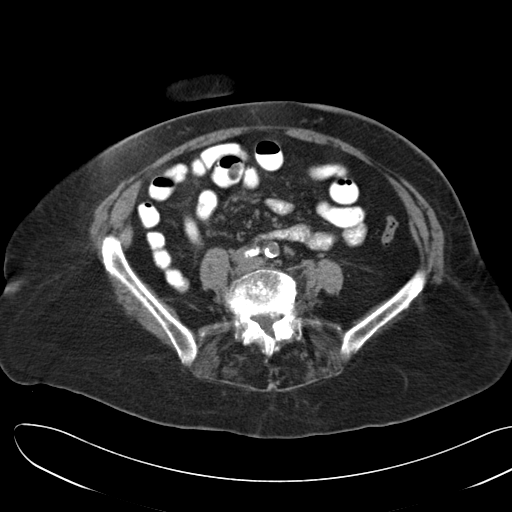
[im 47/90  soft-tissue]
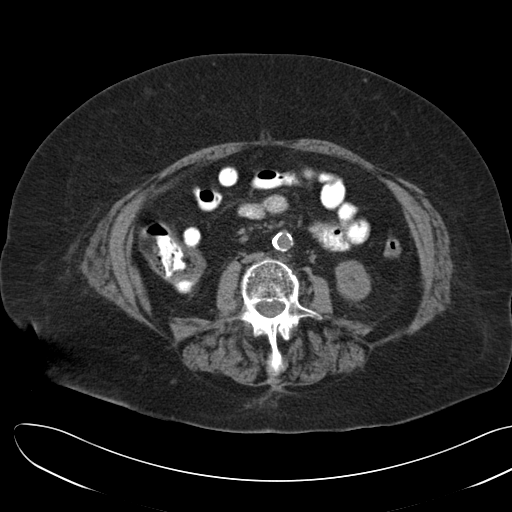
[im 51/90  soft-tissue]
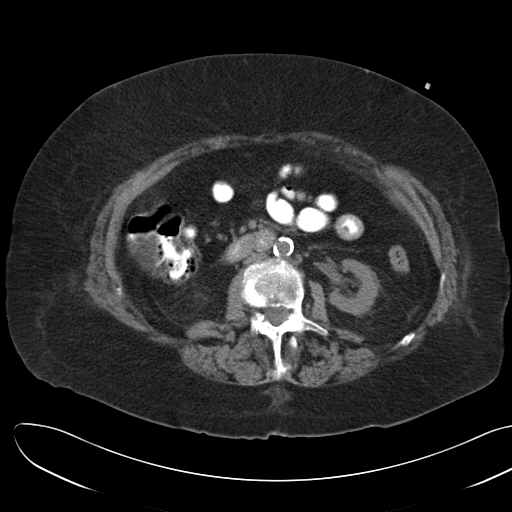
[im 59/90  soft-tissue]
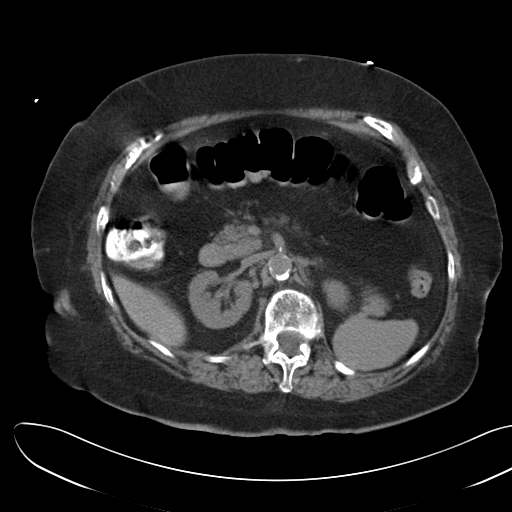
[im 59/90  bone]
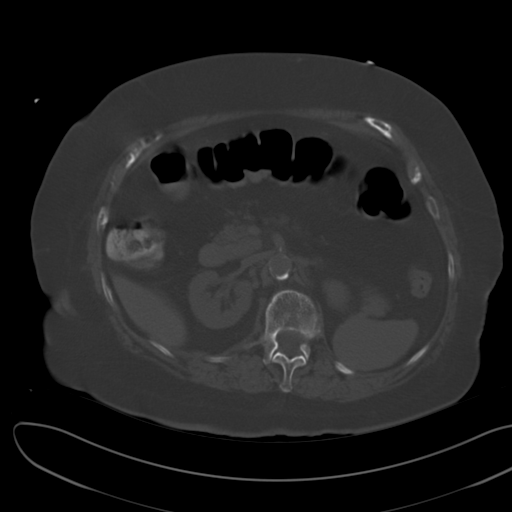
[im 66/90  soft-tissue]
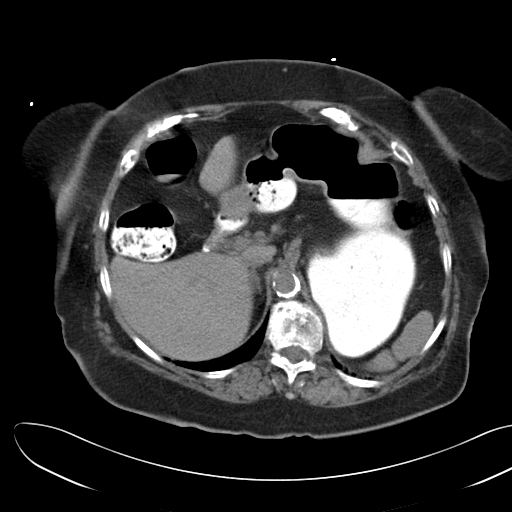
[im 70/90  soft-tissue]
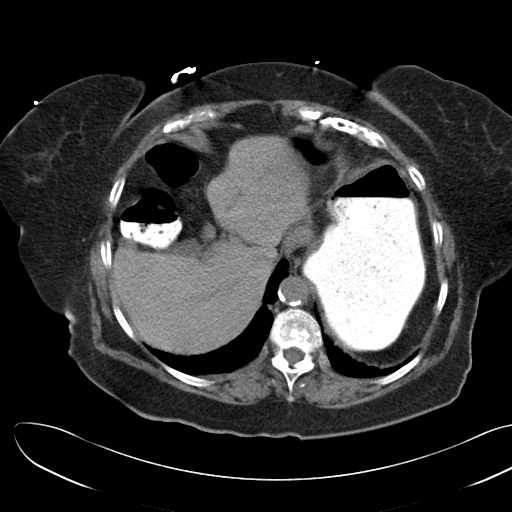
[im 78/90  soft-tissue]
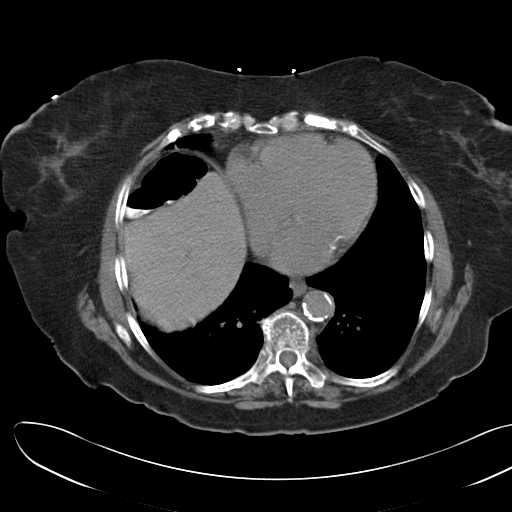
[im 86/90  soft-tissue]
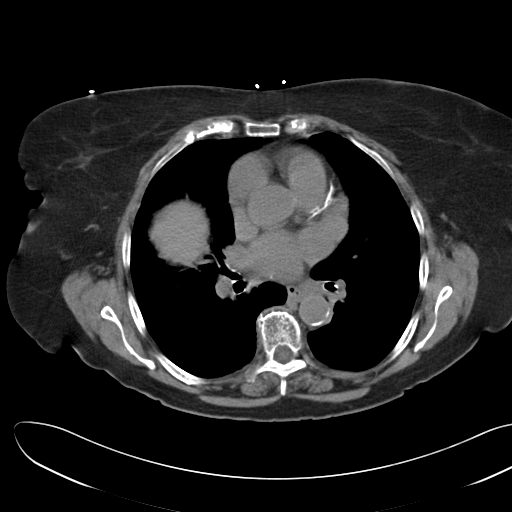

[Series 5: cor routine abd pel wo · coronal · 0.68mm/px · 3 of 131 slices shown]
[im 44/131  soft-tissue]
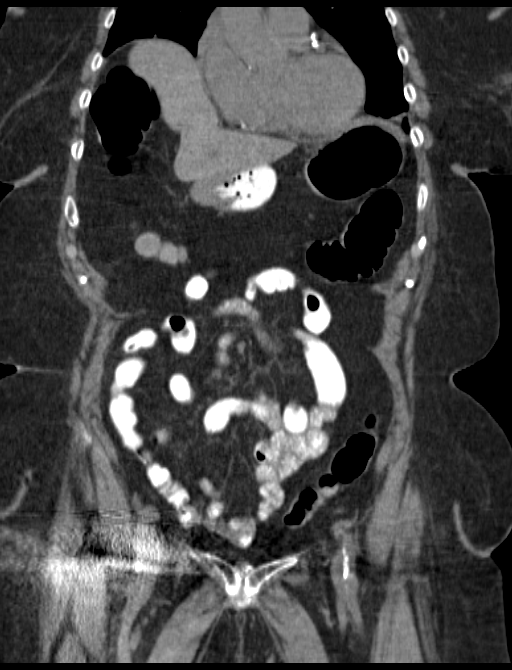
[im 58/131  soft-tissue]
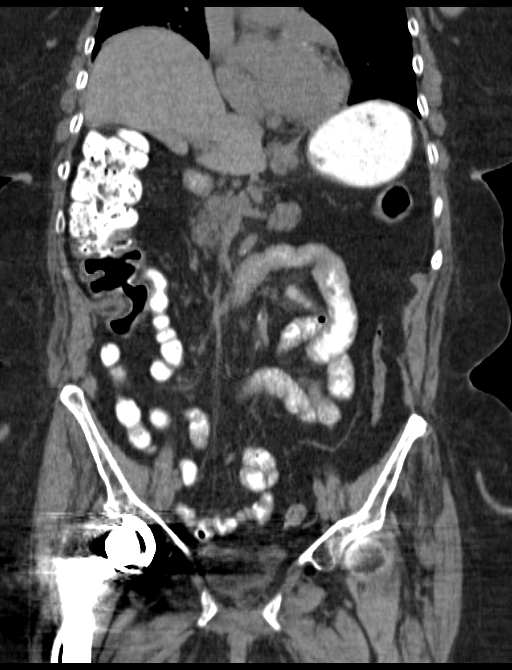
[im 73/131  soft-tissue]
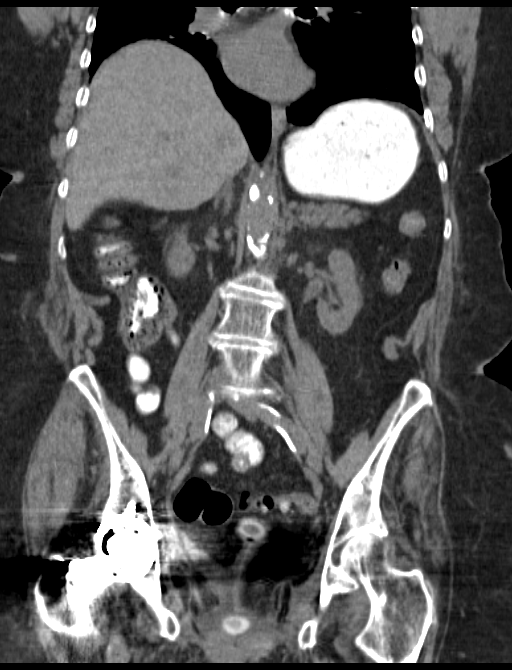

[16 of 46 positions shown; findings below may reference images not displayed]

FINDINGS: Severe multilevel degenerative disc disease is noted in the lumbar
spine. Visualized lung bases appear normal.

Status post right total hip arthroplasty. Status post
cholecystectomy. No focal abnormality is noted in the liver, spleen
or pancreas on these unenhanced images. Adrenal glands appear
normal. Mild bilateral renal atrophy is noted. No hydronephrosis or
renal obstruction is noted. No renal or ureteral calculi are noted.
There is no evidence of bowel obstruction. Atherosclerotic
calcifications of abdominal aorta are noted without aneurysm
formation. Urinary bladder is decompressed. Cerclage device is noted
in the pelvis. No abnormal fluid collection is noted. There is no
significant adenopathy. Mild sigmoid diverticulosis is noted without
inflammation.
IMPRESSION: Mild sigmoid diverticulosis without inflammation.

Mild bilateral renal atrophy. No hydronephrosis or renal obstruction
is noted.

No other significant abnormality seen in the abdomen or pelvis.

## 2015-12-06 ENCOUNTER — Other Ambulatory Visit: Payer: Self-pay | Admitting: Cardiovascular Disease

## 2016-01-01 ENCOUNTER — Encounter: Payer: Self-pay | Admitting: Cardiovascular Disease

## 2016-01-01 ENCOUNTER — Ambulatory Visit (INDEPENDENT_AMBULATORY_CARE_PROVIDER_SITE_OTHER): Payer: Medicare Other | Admitting: Cardiovascular Disease

## 2016-01-01 VITALS — BP 141/85 | HR 72 | Ht 64.0 in | Wt 171.0 lb

## 2016-01-01 DIAGNOSIS — R600 Localized edema: Secondary | ICD-10-CM

## 2016-01-01 DIAGNOSIS — K5791 Diverticulosis of intestine, part unspecified, without perforation or abscess with bleeding: Secondary | ICD-10-CM

## 2016-01-01 DIAGNOSIS — I4891 Unspecified atrial fibrillation: Secondary | ICD-10-CM

## 2016-01-01 DIAGNOSIS — I5032 Chronic diastolic (congestive) heart failure: Secondary | ICD-10-CM | POA: Diagnosis not present

## 2016-01-01 DIAGNOSIS — I10 Essential (primary) hypertension: Secondary | ICD-10-CM

## 2016-01-01 DIAGNOSIS — R0602 Shortness of breath: Secondary | ICD-10-CM

## 2016-01-01 DIAGNOSIS — E785 Hyperlipidemia, unspecified: Secondary | ICD-10-CM

## 2016-01-01 NOTE — Patient Instructions (Signed)
You are doing well. No medication changes were made.  Please take lasix for any leg or foot edema, with potassium   Please call us if you have new issues that need to be addressed before your next appt.  Your physician wants you to follow-up in: 6 months.  You will receive a reminder letter in the mail two months in advance. If you don't receive a letter, please call our office to schedule the follow-up appointment.

## 2016-01-01 NOTE — Assessment & Plan Note (Signed)
Heart rate well controlled, she does not want anticoagulation given prior GI bleed

## 2016-01-01 NOTE — Progress Notes (Signed)
Patient ID: NEONA TALLENT, female    DOB: 08-06-1926, 82 y.o.   MRN: [id number]  HPI Comments: Ms. Bunte is a very pleasant 82 year old woman with a history of hemorrhoidal bleeding who presented to the hospital 06/09/2013 with GI bleeding felt secondary to hemorrhoids and diverticulitis, new onset atrial fibrillation with RVR. It was felt she had developed atrial fibrillation in early October 2014. Prior EKGs earlier in 2014, July, had shown normal sinus rhythm.In the hospital, She was started on Cardizem, digoxin but this was later held. Also started on anticoagulation. Norvasc and ACE inhibitor HCTZ combo was held  for low blood pressure. Creatinine in the hospital 1.47, BUN 26  She presents for routine followup of her chronic atrial fibrillation  In follow-up today, she reports having worsening foot and ankle swelling. Granddaughter is staying with her, eating different foods. Typically takes Lasix every other day Had difficulty finding shoes that would fit her feet, straps are cutting into her feet, edema noted.  Reports blood pressure, heart rate relatively well-controlled. Has shortness of breath on exertion, does not like to go and do anything Gets tired, short of breath if she goes shopping. Already has a disability placard for her car  Denies any significant GI symptoms recently as detailed on her past clinic visit Not wearing her hose Denies having symptoms of tachycardia She denies any chest pain, shortness of breath on exertion, no arm pain on exertion   EKG on today's visit shows atrial fibrillation with ventricular rate 72 bpm, nonspecific ST abnormality  Other past medical history   Not doing regular exercise She's not taking anticoagulation. Recurrent bleeding as below. Hemorrhoids have been okay This was discussed again today with daughter who is up from [place] and the patient. She does not want anticoagulation  Previously started on amiodarone in an attempt to  pharmacologically cardiovert her to normal sinus rhythm. She remained in atrial fibrillation.   she underwent cardioversion on September 28 2013 which was successful in restoring normal sinus rhythm Followup in clinic after episode of insomnia, a throbbing in her head. She was in normal sinus rhythm On her way into the clinic, she hit her foot on her walker and suffered a large hematoma.  Swelling got worse that night and she had severe pain that was 10 over 10. She went to the emergency room but was sent back home. Bruising/hematoma  resolved. Also reports having significant stress when her son-in-law was visiting. He was eating everything in the house She converted back to atrial fibrillation during these events.    April 2015, she had a recurrent hemorrhoidal/diverticuli bleed. eliquis was held at the time of her discharge from the hospital. She saw Dr. Aleen Sells   She elected to restart her eliquis 2.5 mg twice a day. She did take an occasional 5 mg  On this regimen, she had recurrent bleed . She was admitted to the hospital 02/17/2014. No significant change in her hematocrit.  Colonoscopy was performed . Uncertain if bleeding was from internal hemorrhoids or diverticuli .  She presents today and reports having continued trace bleeding which she feels is from her internal hemorrhoids. She is scheduled to see surgery for possible banding. She's not taking anything except for low-dose aspirin.  Echocardiogram in the hospital 06/09/2013 shows ejection fraction 60-65%, normal right ventricular systolic pressure, mildly dilated left atrium  Hematocrit 06/10/2013 was 35   Since stress test in 2009  Echocardiogram 04/13/2013 with same results as above   Allergies  Allergen Reactions  . Celebrex [Celecoxib]   . Codeine   . Ivp Dye [Iodinated Diagnostic Agents]   . Penicillins     Current Outpatient Prescriptions on File Prior to Visit  Medication Sig Dispense Refill  . acetaminophen (TYLENOL)  325 MG tablet Take 650 mg by mouth every 6 (six) hours as needed.    . ALPRAZolam (XANAX) 0.25 MG tablet Take 0.25 mg by mouth at bedtime as needed.     Marland Kitchen aspirin EC 81 MG tablet Take 1 tablet (81 mg total) by mouth daily. 90 tablet 3  . diltiazem (DILACOR XR) 240 MG 24 hr capsule TAKE ONE (1) CAPSULE EACH DAY. 30 capsule 3  . furosemide (LASIX) 20 MG tablet TAKE 1 TABLET TWICE A DAY AS NEEDED 60 tablet 3  . meclizine (ANTIVERT) 25 MG tablet Take 25 mg by mouth 3 (three) times daily as needed.     . Multiple Vitamin (MULTIVITAMIN) tablet Take 1 tablet by mouth daily.    . nitroGLYCERIN (NITROSTAT) 0.4 MG SL tablet Place 1 tablet (0.4 mg total) under the tongue every 5 (five) minutes as needed for chest pain. 25 tablet 3  . olmesartan (BENICAR) 40 MG tablet Take 40 mg by mouth daily.    . potassium chloride (K-DUR) 10 MEQ tablet TAKE 1 TABLET EVERY DAY AS NEEDED 90 tablet 3  . PREMARIN vaginal cream Place 1 Applicatorful vaginally as needed.     . propranolol (INDERAL) 20 MG tablet Take 1 tablet (20 mg total) by mouth 3 (three) times daily as needed. 90 tablet 1  . psyllium (REGULOID) 0.52 G capsule Take 0.52 g by mouth every other day.    . rosuvastatin (CRESTOR) 10 MG tablet Take 10 mg by mouth daily.     No current facility-administered medications on file prior to visit.    Past Medical History  Diagnosis Date  . Hypertension   . Hyperlipidemia   . Diverticulosis   . Hematochezia   . A-fib ([place])   . Hemorrhoids   . GERD (gastroesophageal reflux disease)   . Shortness of breath dyspnea   . Dysrhythmia     afib  . Syncope   . Depression   . Osteoarthritis     ra  . Cancer ([place])     history nose  . Anginal pain ([place])   . Orthopnea   . Palpitations   . Swelling   . Wheezing     Past Surgical History  Procedure Laterality Date  . Knee surgery    . Neck surgery    . Foot surgery    . Total hip arthroplasty      right  . Cardioversion  2015  . Colonoscopy  2015  .  Joint replacement      thr,tkr  . Back surgery      lumbar,neck  . Abdominal hysterectomy    . Eye surgery      cataract  . Cataract extraction w/phaco Left 01/08/2015    Procedure: CATARACT EXTRACTION PHACO AND INTRAOCULAR LENS PLACEMENT (IOC);  Surgeon: Birder Robson, MD;  Location: [facility] ORS;  Service: Ophthalmology;  Laterality: Left;  [place] 01:02 AP% 21.8 CDE 13.69    Social History  reports that she has never smoked. She has never used smokeless tobacco. She reports that she does not drink alcohol or use illicit drugs.  Family History family history includes Heart disease in her son; Hypertension in her son.   Review of Systems  Constitutional: Negative.   Respiratory:  Negative.   Cardiovascular: Positive for leg swelling.  Gastrointestinal: Negative.   Musculoskeletal: Negative.   Skin: Negative.   Neurological: Negative.   Hematological: Negative.   Psychiatric/Behavioral: Negative.   All other systems reviewed and are negative.   BP 141/85 mmHg  Pulse 72  Ht 5\' 4"  (1.626 m)  Wt 171 lb (77.565 kg)  BMI 29.34 kg/m2   Physical Exam  Constitutional: She is oriented to person, place, and time. She appears well-developed and well-nourished.  HENT:  Head: Normocephalic.  Nose: Nose normal.  Mouth/Throat: Oropharynx is clear and moist.  Eyes: Conjunctivae are normal. Pupils are equal, round, and reactive to light.  Neck: Normal range of motion. Neck supple. No JVD present.  Cardiovascular: Normal rate, S1 normal, S2 normal, normal heart sounds and intact distal pulses.  An irregularly irregular rhythm present. Exam reveals no gallop and no friction rub.   No murmur heard. Trace to 1+ pitting lower extremity edema  Pulmonary/Chest: Effort normal and breath sounds normal. No respiratory distress. She has no wheezes. She has no rales. She exhibits no tenderness.  Abdominal: Soft. Bowel sounds are normal. She exhibits no distension. There is no tenderness.   Musculoskeletal: Normal range of motion. She exhibits no edema or tenderness.  Lymphadenopathy:    She has no cervical adenopathy.  Neurological: She is alert and oriented to person, place, and time. Coordination normal.  Skin: Skin is warm and dry. No rash noted. No erythema.  Psychiatric: She has a normal mood and affect. Her behavior is normal. Judgment and thought content normal.    Assessment and Plan  Nursing note and vitals reviewed.

## 2016-01-01 NOTE — Assessment & Plan Note (Signed)
Very deconditioned at baseline. Recommended a regular walking program. She does drive, has disability placard, feels exhausted with minimal exertion    Total encounter time more than 25 minutes  Greater than 50% was spent in counseling and coordination of care with the patient

## 2016-01-01 NOTE — Assessment & Plan Note (Signed)
Worsening lower extremity edema over the past several days, likely secondary to dietary indiscretion.  Recommended she take additional Lasix until edema resolves Modify her diet if needed

## 2016-01-01 NOTE — Assessment & Plan Note (Signed)
Worsening leg edema likely secondary to diastolic CHF. Likely small component of edema secondary to calcium channel blocker. Recommended extra Lasix, leg elevation, compression hose

## 2016-01-01 NOTE — Assessment & Plan Note (Signed)
Recommended that she stay on her Crestor 10 mg daily

## 2016-01-01 NOTE — Assessment & Plan Note (Signed)
Blood pressure is well controlled on today's visit. No changes made to the medications. 

## 2016-01-01 NOTE — Assessment & Plan Note (Signed)
Long discussion concerning risk and benefit of anticoagulation and risk of stroke with atrial fibrillation. She does not want anticoagulation

## 2016-01-27 ENCOUNTER — Other Ambulatory Visit: Payer: Self-pay | Admitting: Internal Medicine

## 2016-01-27 DIAGNOSIS — Z1231 Encounter for screening mammogram for malignant neoplasm of breast: Secondary | ICD-10-CM

## 2016-01-28 ENCOUNTER — Telehealth: Payer: Self-pay | Admitting: Cardiovascular Disease

## 2016-01-28 NOTE — Telephone Encounter (Signed)
Pt states her PCP took her off of her potassium, and wanted to let Dr. Rockey Situ know. She needs to know if she should take it or not. please advise.

## 2016-02-03 ENCOUNTER — Encounter: Payer: Self-pay | Admitting: Cardiovascular Disease

## 2016-02-05 ENCOUNTER — Ambulatory Visit: Payer: Medicare Other

## 2016-03-05 ENCOUNTER — Ambulatory Visit: Payer: Medicare Other | Attending: Internal Medicine

## 2016-03-23 ENCOUNTER — Encounter: Payer: Self-pay | Admitting: Emergency Medicine

## 2016-03-23 ENCOUNTER — Emergency Department: Payer: Medicare Other

## 2016-03-23 ENCOUNTER — Emergency Department
Admission: EM | Admit: 2016-03-23 | Discharge: 2016-03-23 | Disposition: A | Discharge status: Home / Self Care | Payer: Medicare Other | Attending: Emergency Medicine | Admitting: Emergency Medicine

## 2016-03-23 DIAGNOSIS — Z79899 Other long term (current) drug therapy: Secondary | ICD-10-CM | POA: Insufficient documentation

## 2016-03-23 DIAGNOSIS — I130 Hypertensive heart and chronic kidney disease with heart failure and stage 1 through stage 4 chronic kidney disease, or unspecified chronic kidney disease: Secondary | ICD-10-CM | POA: Diagnosis not present

## 2016-03-23 DIAGNOSIS — Z7982 Long term (current) use of aspirin: Secondary | ICD-10-CM | POA: Insufficient documentation

## 2016-03-23 DIAGNOSIS — R42 Dizziness and giddiness: Secondary | ICD-10-CM

## 2016-03-23 DIAGNOSIS — I5032 Chronic diastolic (congestive) heart failure: Secondary | ICD-10-CM | POA: Insufficient documentation

## 2016-03-23 DIAGNOSIS — N390 Urinary tract infection, site not specified: Secondary | ICD-10-CM

## 2016-03-23 DIAGNOSIS — N189 Chronic kidney disease, unspecified: Secondary | ICD-10-CM | POA: Insufficient documentation

## 2016-03-23 LAB — URINALYSIS COMPLETE WITH MICROSCOPIC (ARMC ONLY)
Bilirubin Urine: NEGATIVE
GLUCOSE, UA: NEGATIVE mg/dL
Ketones, ur: NEGATIVE mg/dL
NITRITE: NEGATIVE
Protein, ur: NEGATIVE mg/dL
SPECIFIC GRAVITY, URINE: 1.009 (ref 1.005–1.030)
pH: 6 (ref 5.0–8.0)

## 2016-03-23 LAB — CBC
HEMATOCRIT: 43.9 % (ref 35.0–47.0)
Hemoglobin: 15 g/dL (ref 12.0–16.0)
MCH: 33.1 pg (ref 26.0–34.0)
MCHC: 34.2 g/dL (ref 32.0–36.0)
MCV: 96.6 fL (ref 80.0–100.0)
Platelets: 210 10*3/uL (ref 150–440)
RBC: 4.54 MIL/uL (ref 3.80–5.20)
RDW: 14.1 % (ref 11.5–14.5)
WBC: 6 10*3/uL (ref 3.6–11.0)

## 2016-03-23 LAB — BASIC METABOLIC PANEL
Anion gap: 10 (ref 5–15)
BUN: 21 mg/dL — ABNORMAL HIGH (ref 6–20)
CALCIUM: 9.1 mg/dL (ref 8.9–10.3)
CO2: 28 mmol/L (ref 22–32)
CREATININE: 1.37 mg/dL — AB (ref 0.44–1.00)
Chloride: 100 mmol/L — ABNORMAL LOW (ref 101–111)
GFR calc non Af Amer: 33 mL/min — ABNORMAL LOW (ref >60)
GFR, EST AFRICAN AMERICAN: 39 mL/min — AB (ref >60)
Glucose, Bld: 108 mg/dL — ABNORMAL HIGH (ref 65–99)
Potassium: 4.2 mmol/L (ref 3.5–5.1)
Sodium: 138 mmol/L (ref 135–145)

## 2016-03-23 LAB — TROPONIN I

## 2016-03-23 MED ORDER — DEXTROSE 5 % IV SOLN
1.0000 g | Freq: Once | INTRAVENOUS | Status: AC
  Administered 2016-03-23: 1 g via INTRAVENOUS
  Filled 2016-03-23: qty 10

## 2016-03-23 MED ORDER — CEPHALEXIN 500 MG PO CAPS: 500.0000 mg | ORAL_CAPSULE | Freq: Three times a day (TID) | ORAL | 0 refills | Status: DC

## 2016-03-23 NOTE — ED Notes (Signed)
Patient transported to CT 

## 2016-03-23 NOTE — ED Triage Notes (Signed)
Pt ems from home for dizziness. Pt with hx of vertigo and took meclizine this am. States that she feels better now.

## 2016-03-23 NOTE — ED Provider Notes (Addendum)
[facility] Emergency Department Provider Note  Time seen: 11:24 AM  I have reviewed the triage vital signs and the nursing notes.   HISTORY  Chief Complaint Dizziness    HPI Tabitha Potts is a 82 y.o. female with a past medical history of paroxysmal atrial fibrillation not on blood thinners, hypertension, hyperlipidemia, vertigo, who presents the emergency department with dizziness. According to the patient she has a history of vertigo, she states some dizziness last night, somewhat worse this morning. She took meclizine but states his symptoms did not completely resolve so she came to the emergency department for evaluation. Denies headache, focal weakness or numbness, confusion or slurred speech. No history of stroke per patient. Patient is currently in atrial fibrillation. Patient also states some mild shortness of breath for the past 2 days. She states she always feels short of breath when she goes into atrial fibrillation.Denies any chest pain. Denies any recent cough, congestion or fever. Describes shortness breath is mild. Describes the dizziness as mild, much improved from this morning per patient.  Past Medical History:  Diagnosis Date  . A-fib ([place])   . Anginal pain ([place])   . Cancer ([place])    history nose  . Depression   . Diverticulosis   . Dysrhythmia    afib  . GERD (gastroesophageal reflux disease)   . Hematochezia   . Hemorrhoids   . Hyperlipidemia   . Hypertension   . Orthopnea   . Osteoarthritis    ra  . Palpitations   . Shortness of breath dyspnea   . Swelling   . Syncope   . Wheezing     Patient Active Problem List   Diagnosis Date Noted  . Shortness of breath 01/01/2016  . Abdominal pain 07/02/2015  . Chronic diastolic CHF (congestive heart failure) ([place]) 02/09/2014  . GI bleed 02/08/2014  . Unstable gait 12/14/2013  . Hematoma of lower limb 09/29/2013  . Atrial fibrillation ([place]) 06/20/2013  . Essential hypertension  06/20/2013  . Leg edema 06/20/2013  . Chronic kidney disease 06/20/2013  . Hyperlipidemia 06/20/2013    Past Surgical History:  Procedure Laterality Date  . ABDOMINAL HYSTERECTOMY    . BACK SURGERY     lumbar,neck  . CARDIOVERSION  2015  . CATARACT EXTRACTION W/PHACO Left 01/08/2015   Procedure: CATARACT EXTRACTION PHACO AND INTRAOCULAR LENS PLACEMENT (IOC);  Surgeon: Birder Robson, MD;  Location: [facility] ORS;  Service: Ophthalmology;  Laterality: Left;  [place] 01:02 AP% 21.8 CDE 13.69  . COLONOSCOPY  2015  . EYE SURGERY     cataract  . FOOT SURGERY    . JOINT REPLACEMENT     thr,tkr  . KNEE SURGERY    . NECK SURGERY    . TOTAL HIP ARTHROPLASTY     right    Prior to Admission medications   Medication Sig Start Date End Date Taking? Authorizing Provider  acetaminophen (TYLENOL) 325 MG tablet Take 650 mg by mouth every 6 (six) hours as needed.    Historical Provider, MD  ALPRAZolam Duanne Moron) 0.25 MG tablet Take 0.25 mg by mouth at bedtime as needed.  06/10/13   Historical Provider, MD  aspirin EC 81 MG tablet Take 1 tablet (81 mg total) by mouth daily. 02/22/14   Historical Provider, MD  diltiazem (DILACOR XR) 240 MG 24 hr capsule TAKE ONE (1) CAPSULE EACH DAY. 12/06/15   Minna Merritts, MD  furosemide (LASIX) 20 MG tablet TAKE 1 TABLET TWICE A DAY AS NEEDED  08/13/15   Minna Merritts, MD  meclizine (ANTIVERT) 25 MG tablet Take 25 mg by mouth 3 (three) times daily as needed.  02/14/14   Historical Provider, MD  Multiple Vitamin (MULTIVITAMIN) tablet Take 1 tablet by mouth daily.    Historical Provider, MD  nitroGLYCERIN (NITROSTAT) 0.4 MG SL tablet Place 1 tablet (0.4 mg total) under the tongue every 5 (five) minutes as needed for chest pain. 01/07/15   Minna Merritts, MD  olmesartan (BENICAR) 40 MG tablet Take 40 mg by mouth daily.    Historical Provider, MD  potassium chloride (K-DUR) 10 MEQ tablet TAKE 1 TABLET EVERY DAY AS NEEDED 11/11/15   Minna Merritts, MD  PREMARIN vaginal  cream Place 1 Applicatorful vaginally as needed.  02/26/14   Historical Provider, MD  propranolol (INDERAL) 20 MG tablet Take 1 tablet (20 mg total) by mouth 3 (three) times daily as needed. 01/07/15   Minna Merritts, MD  psyllium (REGULOID) 0.52 G capsule Take 0.52 g by mouth every other day.    Historical Provider, MD  rosuvastatin (CRESTOR) 10 MG tablet Take 10 mg by mouth daily.    Historical Provider, MD    Allergies  Allergen Reactions  . Celebrex [Celecoxib]   . Codeine   . Ivp Dye [Iodinated Diagnostic Agents]   . Penicillins     Family History  Problem Relation Age of Onset  . Heart disease Son   . Hypertension Son     Social History Social History  Substance Use Topics  . Smoking status: Never Smoker  . Smokeless tobacco: Never Used  . Alcohol use No    Review of Systems Constitutional: Negative for fever. Cardiovascular: Negative for chest pain. Respiratory: Mild shortness breath. Gastrointestinal: Negative for abdominal pain Musculoskeletal: Negative for back pain Neurological: Negative for headaches, focal weakness or numbness. 10-point ROS otherwise negative.  ____________________________________________   PHYSICAL EXAM:  VITAL SIGNS: ED Triage Vitals [03/23/16 1025]  Enc Vitals Group     BP (!) 165/74     Pulse Rate 78     Resp      Temp 98.2 F (36.8 C)     Temp Source Oral     SpO2 97 %     Weight 162 lb (73.5 kg)     Height 5\' 4"  (1.626 m)     Head Circumference      Peak Flow      Pain Score      Pain Loc      Pain Edu?      Excl. in [place]?     Constitutional: Alert and oriented. Well appearing and in no distress. Eyes: Normal exam ENT   Head: Normocephalic and atraumatic   Mouth/Throat: Mucous membranes are moist. Cardiovascular: Normal rate, regular rhythm. No murmur Respiratory: Normal respiratory effort without tachypnea nor retractions. Breath sounds are clear  Gastrointestinal: Soft and nontender. No distention.    Musculoskeletal: Nontender with normal range of motion in all extremities. Neurologic:  Normal speech and language. No gross focal neurologic deficits are appreciated. Normal finger to nose testing. No pronator drift. 5/5 motor in all extremities. Skin:  Skin is warm, dry and intact.  Psychiatric: Mood and affect are normal. Speech and behavior are normal.   ____________________________________________    EKG  EKG reviewed and interpreted by myself shows atrial for ablation 86 bpm, narrow QS, normal axis, normal intervals. Nonspecific but no concerning ST changes.  ____________________________________________    RADIOLOGY  CT head shows  no acute abnormality  ____________________________________________   INITIAL IMPRESSION / ASSESSMENT AND PLAN / ED COURSE  Pertinent labs & imaging results that were available during my care of the patient were reviewed by me and considered in my medical decision making (see chart for details).  Patient presents the emergency department with dizziness. Has a history of vertigo which she states this feels similar but she took meclizine this morning and states her symptoms did not completely go away but they didn't improve, she came to the emergency department for evaluation. Chest is states a mild shortness of breath which is typical of her atrial fibrillation per patient. We will check labs, chest x-ray, CT head. Patient has a very normal neurologic exam including finger-to-nose testing.  Patient's labs have resulted showing a urinary tract infection. We will treat with Rocephin, and discharged with Keflex. Patient states her dizziness has completely resolved. She has meclizine to take at home. I discussed with the patient strict return precautions. Patient is agreeable.  Patient states her penicillin allergy is itching but states as long time ago. I believe it would be safe for the patient to receive  cephalosporins. ____________________________________________   FINAL CLINICAL IMPRESSION(S) / ED DIAGNOSES  Dizziness Urinary tract infection   Harvest Dark, MD 03/23/16 [place], MD 03/23/16 1349

## 2016-04-21 ENCOUNTER — Encounter: Payer: Self-pay | Admitting: Emergency Medicine

## 2016-04-21 ENCOUNTER — Ambulatory Visit
Admission: EM | Admit: 2016-04-21 | Discharge: 2016-04-21 | Disposition: A | Discharge status: Home / Self Care | Payer: Medicare Other | Attending: Family Medicine | Admitting: Family Medicine

## 2016-04-21 DIAGNOSIS — N390 Urinary tract infection, site not specified: Secondary | ICD-10-CM | POA: Diagnosis not present

## 2016-04-21 DIAGNOSIS — R300 Dysuria: Secondary | ICD-10-CM | POA: Diagnosis present

## 2016-04-21 LAB — URINALYSIS COMPLETE WITH MICROSCOPIC (ARMC ONLY)
Bilirubin Urine: NEGATIVE
Glucose, UA: NEGATIVE mg/dL
Hgb urine dipstick: NEGATIVE
KETONES UR: NEGATIVE mg/dL
Nitrite: NEGATIVE
PH: 6.5 (ref 5.0–8.0)
PROTEIN: NEGATIVE mg/dL
Specific Gravity, Urine: 1.015 (ref 1.005–1.030)

## 2016-04-21 MED ORDER — CIPROFLOXACIN HCL 500 MG PO TABS: 500.0000 mg | ORAL_TABLET | Freq: Two times a day (BID) | ORAL | 0 refills | Status: DC

## 2016-04-21 NOTE — ED Provider Notes (Signed)
CSN: [id number]     Arrival date & time 04/21/16  1854 History   First MD Initiated Contact with Patient 04/21/16 1953     Chief Complaint  Patient presents with  . Dysuria   (Consider location/radiation/quality/duration/timing/severity/associated sxs/prior Treatment) HPI  This 82 year old female who is brought in by her daughter complaining of dysuria with burning and awakening this morning feeling ill. Review of her medical records she was diagnosed with a UTI with the same symptoms and she was seen in the emergency room on 03/23/2016. She is given a shot of Rocephin and placed on Keflex but had a significant reaction to the Keflex and was then changed to Levaquin. She states after taking the Levaquin she felt "wonderful". She has a history of allergy to penicillin and sulfa and now possibly Keflex. She finished her antibiotic therapy approximately 2 weeks ago he was feeling well until just recently. He has had no fever or chills. She attempted to make an appointment with Dr. Chancy Milroy her primary care physician was told that they had no openings. She come here for evaluation.        Past Medical History:  Diagnosis Date  . A-fib ([place])   . Anginal pain ([place])   . Cancer ([place])    history nose  . Depression   . Diverticulosis   . Dysrhythmia    afib  . GERD (gastroesophageal reflux disease)   . Hematochezia   . Hemorrhoids   . Hyperlipidemia   . Hypertension   . Orthopnea   . Osteoarthritis    ra  . Palpitations   . Shortness of breath dyspnea   . Swelling   . Syncope   . Wheezing    Past Surgical History:  Procedure Laterality Date  . ABDOMINAL HYSTERECTOMY    . BACK SURGERY     lumbar,neck  . CARDIOVERSION  2015  . CATARACT EXTRACTION W/PHACO Left 01/08/2015   Procedure: CATARACT EXTRACTION PHACO AND INTRAOCULAR LENS PLACEMENT (IOC);  Surgeon: Birder Robson, MD;  Location: [facility] ORS;  Service: Ophthalmology;  Laterality: Left;  [place] 01:02 AP% 21.8 CDE 13.69  .  COLONOSCOPY  2015  . EYE SURGERY     cataract  . FOOT SURGERY    . JOINT REPLACEMENT     thr,tkr  . KNEE SURGERY    . NECK SURGERY    . TOTAL HIP ARTHROPLASTY     right   Family History  Problem Relation Age of Onset  . Heart disease Son   . Hypertension Son    Social History  Substance Use Topics  . Smoking status: Never Smoker  . Smokeless tobacco: Never Used  . Alcohol use No   OB History    Gravida Para Term Preterm AB Living   3 3       3    SAB TAB Ectopic Multiple Live Births                 Review of Systems  Constitutional: Positive for activity change. Negative for chills, fatigue and fever.  Genitourinary: Positive for dysuria.  Neurological: Positive for dizziness.  Psychiatric/Behavioral: Negative for confusion and decreased concentration.  All other systems reviewed and are negative.   Allergies  Celebrex [celecoxib]; Codeine; Gabapentin; Ivp dye [iodinated diagnostic agents]; Keflex [cephalexin]; Penicillins; and Sulfa antibiotics  Home Medications   Prior to Admission medications   Medication Sig Start Date End Date Taking? Authorizing Provider  acetaminophen (TYLENOL) 325 MG tablet Take 650 mg by mouth every 6 (  six) hours as needed.    Historical Provider, MD  ALPRAZolam Duanne Moron) 0.25 MG tablet Take 0.25 mg by mouth at bedtime as needed.  06/10/13   Historical Provider, MD  aspirin EC 81 MG tablet Take 1 tablet (81 mg total) by mouth daily. 02/22/14   Historical Provider, MD  ciprofloxacin (CIPRO) 500 MG tablet Take 1 tablet (500 mg total) by mouth every 12 (twelve) hours. 04/21/16   Lorin Picket, PA-C  diltiazem (DILACOR XR) 240 MG 24 hr capsule TAKE ONE (1) CAPSULE EACH DAY. 12/06/15   Minna Merritts, MD  furosemide (LASIX) 20 MG tablet TAKE 1 TABLET TWICE A DAY AS NEEDED 08/13/15   Minna Merritts, MD  meclizine (ANTIVERT) 25 MG tablet Take 25 mg by mouth 3 (three) times daily as needed.  02/14/14   Historical Provider, MD  Multiple Vitamin  (MULTIVITAMIN) tablet Take 1 tablet by mouth daily.    Historical Provider, MD  nitroGLYCERIN (NITROSTAT) 0.4 MG SL tablet Place 1 tablet (0.4 mg total) under the tongue every 5 (five) minutes as needed for chest pain. 01/07/15   Minna Merritts, MD  olmesartan (BENICAR) 40 MG tablet Take 40 mg by mouth daily.    Historical Provider, MD  potassium chloride (K-DUR) 10 MEQ tablet TAKE 1 TABLET EVERY DAY AS NEEDED 11/11/15   Minna Merritts, MD  PREMARIN vaginal cream Place 1 Applicatorful vaginally as needed.  02/26/14   Historical Provider, MD  propranolol (INDERAL) 20 MG tablet Take 1 tablet (20 mg total) by mouth 3 (three) times daily as needed. 01/07/15   Minna Merritts, MD  psyllium (REGULOID) 0.52 G capsule Take 0.52 g by mouth every other day.    Historical Provider, MD  rosuvastatin (CRESTOR) 10 MG tablet Take 10 mg by mouth daily.    Historical Provider, MD   Meds Ordered and Administered this Visit  Medications - No data to display  BP (!) 162/75 (BP Location: Left Arm)   Pulse 83   Temp 97.7 F (36.5 C) (Tympanic)   Resp 17   Ht 5\' 2"  (1.575 m)   Wt 164 lb (74.4 kg)   SpO2 96%   BMI 30.00 kg/m  No data found.   Physical Exam  Constitutional: She is oriented to person, place, and time. She appears well-developed and well-nourished. No distress.  HENT:  Head: Normocephalic and atraumatic.  Eyes: EOM are normal. Pupils are equal, round, and reactive to light. Right eye exhibits no discharge. Left eye exhibits no discharge.  Neck: Normal range of motion. Neck supple.  Musculoskeletal: Normal range of motion. She exhibits tenderness. She exhibits no edema or deformity.  Neurological: She is alert and oriented to person, place, and time.  Skin: Skin is warm and dry. She is not diaphoretic.  Psychiatric: She has a normal mood and affect. Her behavior is normal. Judgment and thought content normal.  Nursing note and vitals reviewed.   Urgent Care Course   Clinical Course     Procedures (including critical care time)  Labs Review Labs Reviewed  URINALYSIS COMPLETEWITH MICROSCOPIC ([facility] ONLY) - Abnormal; Notable for the following:       Result Value   APPearance HAZY (*)    Leukocytes, UA SMALL (*)    Bacteria, UA FEW (*)    Squamous Epithelial / LPF 6-30 (*)    All other components within normal limits  URINE CULTURE    Imaging Review No results found.   Visual Acuity Review  Right Eye Distance:   Left Eye Distance:   Bilateral Distance:    Right Eye Near:   Left Eye Near:    Bilateral Near:         MDM   1. UTI (lower urinary tract infection)    Discharge Medication List as of 04/21/2016  8:19 PM    Plan: 1. Test/x-ray results and diagnosis reviewed with patient 2. rx as per orders; risks, benefits, potential side effects reviewed with patient 3. Recommend supportive treatment with Increased fluids. I told the patient and her daughter the culture of her urine will be performed and be available in 24 hours. I've also told them that she is not feeling improvement in a day or so that they should go to the emergency department for further evaluation and treatment. Told him that elderly people with UTIs can become septic very quickly. She should not wait if she is not improving. No other source was found for her feeling ill. She does not appear toxic today at all. Does not appear that she is drinking sufficient amounts of fluids encouraged her to increase her fluids significantly. Her urine should be clear to pale yellow. 4. F/u prn if symptoms worsen or don't improve     Lorin Picket, PA-C 04/21/16 [place], [place] 04/21/16 2035

## 2016-04-21 NOTE — ED Triage Notes (Addendum)
Patient states that she has had some burning and not feeling good that started this morning.  Patient states that she had a UTI couple of weeks ago and was treated with Levaquin.

## 2016-04-23 ENCOUNTER — Telehealth: Payer: Self-pay | Admitting: *Deleted

## 2016-04-23 LAB — URINE CULTURE

## 2016-04-23 NOTE — Telephone Encounter (Signed)
Patient called requesting results of urine culture. Informed patient that the results were inconclusive as to which bacteria was causing her UTI and that recollection would be advised.  Patient did report that she was feeling better and would follow up with her PCP if symptoms persist.

## 2016-05-22 ENCOUNTER — Encounter: Payer: Self-pay | Admitting: Emergency Medicine

## 2016-05-22 ENCOUNTER — Emergency Department
Admission: EM | Admit: 2016-05-22 | Discharge: 2016-05-23 | Disposition: A | Discharge status: Home / Self Care | Payer: Medicare Other | Attending: Emergency Medicine | Admitting: Emergency Medicine

## 2016-05-22 DIAGNOSIS — K648 Other hemorrhoids: Secondary | ICD-10-CM | POA: Insufficient documentation

## 2016-05-22 DIAGNOSIS — K644 Residual hemorrhoidal skin tags: Secondary | ICD-10-CM

## 2016-05-22 DIAGNOSIS — Z8522 Personal history of malignant neoplasm of nasal cavities, middle ear, and accessory sinuses: Secondary | ICD-10-CM | POA: Insufficient documentation

## 2016-05-22 DIAGNOSIS — Z96659 Presence of unspecified artificial knee joint: Secondary | ICD-10-CM | POA: Insufficient documentation

## 2016-05-22 DIAGNOSIS — R531 Weakness: Secondary | ICD-10-CM

## 2016-05-22 DIAGNOSIS — I5032 Chronic diastolic (congestive) heart failure: Secondary | ICD-10-CM | POA: Diagnosis not present

## 2016-05-22 DIAGNOSIS — Z7982 Long term (current) use of aspirin: Secondary | ICD-10-CM | POA: Insufficient documentation

## 2016-05-22 DIAGNOSIS — I130 Hypertensive heart and chronic kidney disease with heart failure and stage 1 through stage 4 chronic kidney disease, or unspecified chronic kidney disease: Secondary | ICD-10-CM | POA: Diagnosis not present

## 2016-05-22 DIAGNOSIS — N189 Chronic kidney disease, unspecified: Secondary | ICD-10-CM | POA: Insufficient documentation

## 2016-05-22 DIAGNOSIS — E871 Hypo-osmolality and hyponatremia: Secondary | ICD-10-CM | POA: Insufficient documentation

## 2016-05-22 LAB — URINALYSIS COMPLETE WITH MICROSCOPIC (ARMC ONLY)
Bilirubin Urine: NEGATIVE
Glucose, UA: NEGATIVE mg/dL
HGB URINE DIPSTICK: NEGATIVE
Ketones, ur: NEGATIVE mg/dL
LEUKOCYTES UA: NEGATIVE
NITRITE: NEGATIVE
PH: 7 (ref 5.0–8.0)
PROTEIN: NEGATIVE mg/dL
Specific Gravity, Urine: 1.004 — ABNORMAL LOW (ref 1.005–1.030)

## 2016-05-22 LAB — TROPONIN I: Troponin I: <0.03 ng/mL (ref <0.03)

## 2016-05-22 LAB — BASIC METABOLIC PANEL
ANION GAP: 10 (ref 5–15)
BUN: 16 mg/dL (ref 6–20)
CHLORIDE: 92 mmol/L — AB (ref 101–111)
CO2: 29 mmol/L (ref 22–32)
Calcium: 9.2 mg/dL (ref 8.9–10.3)
Creatinine, Ser: 1.48 mg/dL — ABNORMAL HIGH (ref 0.44–1.00)
GFR, EST AFRICAN AMERICAN: 35 mL/min — AB (ref >60)
GFR, EST NON AFRICAN AMERICAN: 30 mL/min — AB (ref >60)
Glucose, Bld: 101 mg/dL — ABNORMAL HIGH (ref 65–99)
POTASSIUM: 3.7 mmol/L (ref 3.5–5.1)
SODIUM: 131 mmol/L — AB (ref 135–145)

## 2016-05-22 LAB — TYPE AND SCREEN
ABO/RH(D): O POS
ANTIBODY SCREEN: NEGATIVE

## 2016-05-22 LAB — CBC
HEMATOCRIT: 41.5 % (ref 35.0–47.0)
HEMOGLOBIN: 13.9 g/dL (ref 12.0–16.0)
MCH: 32.3 pg (ref 26.0–34.0)
MCHC: 33.4 g/dL (ref 32.0–36.0)
MCV: 96.5 fL (ref 80.0–100.0)
Platelets: 282 10*3/uL (ref 150–440)
RBC: 4.3 MIL/uL (ref 3.80–5.20)
RDW: 14.2 % (ref 11.5–14.5)
WBC: 9 10*3/uL (ref 3.6–11.0)

## 2016-05-22 LAB — GLUCOSE, CAPILLARY: Glucose-Capillary: 98 mg/dL (ref 65–99)

## 2016-05-22 MED ORDER — SODIUM CHLORIDE 0.9 % IV BOLUS (SEPSIS)
500.0000 mL | Freq: Once | INTRAVENOUS | Status: AC
  Administered 2016-05-23: 500 mL via INTRAVENOUS

## 2016-05-22 MED ORDER — HYDROCORTISONE 2.5 % RE CREA: TOPICAL_CREAM | RECTAL | 1 refills | Status: AC

## 2016-05-22 MED ORDER — SODIUM CHLORIDE 0.9 % IV BOLUS (SEPSIS): 1000.0000 mL | Freq: Once | INTRAVENOUS | Status: DC

## 2016-05-22 NOTE — ED Notes (Signed)
Patient urged to urinate, she said she would use the call bell when she felt like she could go.

## 2016-05-22 NOTE — Discharge Instructions (Signed)
Please talk to your primary care doctor about your symptoms, bleeding, and your reaction to kayexylate, and to have your sodium level rechecked.  Return to the emergency department for bleeding, lightheadedness, shortness of breath, fainting, fever, chest pain or palpitations, or any other symptoms concerning to you.

## 2016-05-22 NOTE — ED Triage Notes (Signed)
Per EMS, patient has hx of hemorrhoids and she states she has had "excessive" bleeding from them since Tuesday and she is starting to feel weak.  She says she is going through 2-3 pads a day at home.  She denies any falls or decreased LOC, she just feels weak.  VS are WNL as of triage.

## 2016-05-22 NOTE — ED Provider Notes (Signed)
[facility] ([facility]) Emergency Department Provider Note  ____________________________________________  Time seen: Approximately 10:33 PM  I have reviewed the triage vital signs and the nursing notes.   HISTORY  Chief Complaint Weakness    HPI Tabitha Potts is a 82 y.o. female a history of hyperkalemia on Kayexalate presenting for bleeding hemorrhoids and generalized weakness.The patient reports that over the past month she has had 2 episodes of hyperkalemia which have been treated by her primary care physician with Kayexalate. This has resulted in loose stools and diarrhea, associated with bleeding hemorrhoids. In addition she has had 2 urinary tract infections that and has completed antibiotics for them. Today, she had 2 episodes of bright red blood per rectum with generalized weakness but no shortness of breath, lightheadedness or chest pain. No palpitations or fever. No abdominal pain.  The patient does have a history of A. fib but is only on low-dose aspirin and is not otherwise anticoagulated.  Past Medical History:  Diagnosis Date  . A-fib ([place])   . Anginal pain ([place])   . Cancer ([place])    history nose  . Depression   . Diverticulosis   . Dysrhythmia    afib  . GERD (gastroesophageal reflux disease)   . Hematochezia   . Hemorrhoids   . Hyperlipidemia   . Hypertension   . Orthopnea   . Osteoarthritis    ra  . Palpitations   . Shortness of breath dyspnea   . Swelling   . Syncope   . Wheezing     Patient Active Problem List   Diagnosis Date Noted  . Shortness of breath 01/01/2016  . Abdominal pain 07/02/2015  . Chronic diastolic CHF (congestive heart failure) ([place]) 02/09/2014  . GI bleed 02/08/2014  . Unstable gait 12/14/2013  . Hematoma of lower limb 09/29/2013  . Atrial fibrillation ([place]) 06/20/2013  . Essential hypertension 06/20/2013  . Leg edema 06/20/2013  . Chronic kidney disease 06/20/2013  . Hyperlipidemia 06/20/2013    Past  Surgical History:  Procedure Laterality Date  . ABDOMINAL HYSTERECTOMY    . BACK SURGERY     lumbar,neck  . CARDIOVERSION  2015  . CATARACT EXTRACTION W/PHACO Left 01/08/2015   Procedure: CATARACT EXTRACTION PHACO AND INTRAOCULAR LENS PLACEMENT (IOC);  Surgeon: Birder Robson, MD;  Location: [facility] ORS;  Service: Ophthalmology;  Laterality: Left;  [place] 01:02 AP% 21.8 CDE 13.69  . COLONOSCOPY  2015  . EYE SURGERY     cataract  . FOOT SURGERY    . JOINT REPLACEMENT     thr,tkr  . KNEE SURGERY    . NECK SURGERY    . TOTAL HIP ARTHROPLASTY     right    Current Outpatient Rx  . Order #: [id number] Class: Historical Med  . Order #: [id number] Class: Historical Med  . Order #: [id number] Class: OTC  . Order #: [id number] Class: Print  . Order #: [id number] Class: Normal  . Order #: [id number] Class: Normal  . Order #: [id number] Class: Print  . Order #: [id number] Class: Historical Med  . Order #: [id number] Class: Historical Med  . Order #: [id number] Class: Normal  . Order #: [id number] Class: Historical Med  . Order #: [id number] Class: Normal  . Order #: [id number] Class: Historical Med  . Order #: [id number] Class: Normal  . Order #: [id number] Class: Historical Med  . Order #: [id number] Class: Historical Med    Allergies Celebrex [celecoxib]; Codeine; Gabapentin; Ivp dye [iodinated diagnostic agents]; Keflex [cephalexin]; Penicillins; and Sulfa antibiotics  Family History  Problem Relation Age of Onset  .  Heart disease Son   . Hypertension Son     Social History Social History  Substance Use Topics  . Smoking status: Never Smoker  . Smokeless tobacco: Never Used  . Alcohol use No    Review of Systems Constitutional: No fever/chills. No lightheadedness or syncope. Has a generalized weakness. Eyes: No visual changes. ENT: No sore throat. No congestion or rhinorrhea. Cardiovascular: Denies chest pain. Denies palpitations. Respiratory: Denies shortness of breath.  No cough. Gastrointestinal: No  abdominal pain.  No nausea, no vomiting.  Positive diarrhea.  No constipation. Genitourinary: Negative for dysuria. Positive for bright red blood per rectum Musculoskeletal: Negative for back pain. Skin: Negative for rash. Neurological: Negative for headaches. No focal numbness, tingling or weakness.   10-point ROS otherwise negative.  ____________________________________________   PHYSICAL EXAM:  VITAL SIGNS: ED Triage Vitals  Enc Vitals Group     BP 05/22/16 2128 (!) 162/77     Pulse Rate 05/22/16 2128 79     Resp 05/22/16 2128 20     Temp 05/22/16 2128 98.6 F (37 C)     Temp Source 05/22/16 2128 Oral     SpO2 05/22/16 2128 97 %     Weight 05/22/16 2129 179 lb (81.2 kg)     Height 05/22/16 2129 5\' 4"  (1.626 m)     Head Circumference --      Peak Flow --      Pain Score --      Pain Loc --      Pain Edu? --      Excl. in [place]? --     Constitutional: Alert and oriented. Well appearing and in no acute distress. Answers questions appropriately. Eyes: Conjunctivae are normalAnd without pallor.  EOMI. No scleral icterus. Head: Atraumatic. Nose: No congestion/rhinnorhea. Mouth/Throat: Mucous membranes are moist.  Neck: No stridor.  Supple.   Cardiovascular: Normal rate, regular rhythm. No murmurs, rubs or gallops.  Respiratory: Normal respiratory effort.  No accessory muscle use or retractions. Lungs CTAB.  No wheezes, rales or ronchi. Gastrointestinal: Soft, nontender and nondistended.  No guarding or rebound.  No peritoneal signs. Genitourinary: Multiple moderately enlarged external hemorrhoids that are nonbleeding and nonthrombosed. Palpable internal hemorrhoids with bright red blood per rectum mixed with stool. Musculoskeletal: No LE edema.  Neurologic:  A&Ox3.  Speech is clear.  Face and smile are symmetric.  EOMI.  Moves all extremities well. Skin:  Skin is warm, dry and intact. No rash noted. Psychiatric: Mood and affect are normal. Speech and behavior are normal.   Normal judgement.  ____________________________________________   LABS (all labs ordered are listed, but only abnormal results are displayed)  Labs Reviewed  BASIC METABOLIC PANEL - Abnormal; Notable for the following:       Result Value   Sodium 131 (*)    Chloride 92 (*)    Glucose, Bld 101 (*)    Creatinine, Ser 1.48 (*)    GFR calc non Af Amer 30 (*)    GFR calc Af Amer 35 (*)    All other components within normal limits  URINALYSIS COMPLETEWITH MICROSCOPIC ([facility] ONLY) - Abnormal; Notable for the following:    Color, Urine STRAW (*)    APPearance CLEAR (*)    Specific Gravity, Urine 1.004 (*)    Bacteria, UA RARE (*)    Squamous Epithelial / LPF 0-5 (*)    All other components within normal limits  CBC  GLUCOSE, CAPILLARY  TROPONIN I  CBG MONITORING, ED  TYPE  AND SCREEN   ____________________________________________  EKG  ED ECG REPORT I, Eula Listen, the attending physician, personally viewed and interpreted this ECG.   Date: 05/22/2016  EKG Time: 2131  Rate: 85  Rhythm: atrial fibrillation  Axis: Leftward  Intervals:none  ST&T Change: No ST elevation.  ____________________________________________  RADIOLOGY  No results found.  ____________________________________________   PROCEDURES  Procedure(s) performed: None  Procedures  Critical Care performed: No ____________________________________________   INITIAL IMPRESSION / ASSESSMENT AND PLAN / ED COURSE  Pertinent labs & imaging results that were available during my care of the patient were reviewed by me and considered in my medical decision making (see chart for details).  82 y.o. female with bleeding hemorrhoids after Kayexalate induced diarrhea. The patient is hemodynamically stable, and has a normal hemoglobin and hematocrit. I will look for other causes of generalized weakness including UTI, electrolyte abnormality.  There is no evidence of arrhythmia other than her baseline  atrial fibrillation which is well-controlled.  And her labs, the patient has a mild hyponatremia, which have treated with intravenous fluids. At this time, the patient is stable for discharge and will follow up with her primary care physician for her hemorrhoids, bleeding, and for sodium rechecked. Return precautions and follow-up instructions were discussed..  ____________________________________________  FINAL CLINICAL IMPRESSION(S) / ED DIAGNOSES  Final diagnoses:  Hyponatremia  External hemorrhoid  Bleeding internal hemorrhoids  Generalized weakness    Clinical Course      NEW MEDICATIONS STARTED DURING THIS VISIT:  New Prescriptions   HYDROCORTISONE (ANUSOL-HC) 2.5 % RECTAL CREAM    Apply rectally 2 times daily      Eula Listen, MD 05/22/16 2345

## 2016-05-23 DIAGNOSIS — K648 Other hemorrhoids: Secondary | ICD-10-CM | POA: Diagnosis not present

## 2016-05-27 ENCOUNTER — Encounter: Payer: Self-pay | Admitting: Urology

## 2016-05-27 ENCOUNTER — Ambulatory Visit (INDEPENDENT_AMBULATORY_CARE_PROVIDER_SITE_OTHER): Payer: Medicare Other | Admitting: Urology

## 2016-05-27 VITALS — BP 146/78 | HR 102 | Wt 170.0 lb

## 2016-05-27 DIAGNOSIS — N390 Urinary tract infection, site not specified: Secondary | ICD-10-CM

## 2016-05-27 DIAGNOSIS — M76899 Other specified enthesopathies of unspecified lower limb, excluding foot: Secondary | ICD-10-CM | POA: Insufficient documentation

## 2016-05-27 DIAGNOSIS — M169 Osteoarthritis of hip, unspecified: Secondary | ICD-10-CM | POA: Insufficient documentation

## 2016-05-27 DIAGNOSIS — M1610 Unilateral primary osteoarthritis, unspecified hip: Secondary | ICD-10-CM | POA: Insufficient documentation

## 2016-05-27 DIAGNOSIS — IMO0002 Reserved for concepts with insufficient information to code with codable children: Secondary | ICD-10-CM | POA: Insufficient documentation

## 2016-05-27 DIAGNOSIS — M659 Synovitis and tenosynovitis, unspecified: Secondary | ICD-10-CM | POA: Insufficient documentation

## 2016-05-27 DIAGNOSIS — M069 Rheumatoid arthritis, unspecified: Secondary | ICD-10-CM | POA: Insufficient documentation

## 2016-05-27 DIAGNOSIS — M064 Inflammatory polyarthropathy: Secondary | ICD-10-CM | POA: Insufficient documentation

## 2016-05-27 DIAGNOSIS — C4491 Basal cell carcinoma of skin, unspecified: Secondary | ICD-10-CM | POA: Insufficient documentation

## 2016-05-27 LAB — URINALYSIS, COMPLETE
BILIRUBIN UA: NEGATIVE
Glucose, UA: NEGATIVE
Ketones, UA: NEGATIVE
Nitrite, UA: NEGATIVE
PH UA: 5.5 (ref 5.0–7.5)
RBC UA: NEGATIVE
Specific Gravity, UA: 1.025 (ref 1.005–1.030)
UUROB: 0.2 mg/dL (ref 0.2–1.0)

## 2016-05-27 LAB — MICROSCOPIC EXAMINATION

## 2016-05-28 NOTE — Progress Notes (Signed)
This patient was referred to urology for recurrent UTIs by her PCP, Dr. Humphrey Rolls, however, has only had 1 urinary tract infection over the past year.  She thinks she is here today for a pessary fitting/exchange. She currently has a pessary in place but this is chipped and needs a new one.  We will not charge her for appointment and ensure that she is rescheduled with OB/GYN for pessary fitting.  Hollice Espy, MD

## 2016-06-29 ENCOUNTER — Ambulatory Visit: Payer: Medicare Other | Admitting: Cardiovascular Disease

## 2016-07-27 ENCOUNTER — Ambulatory Visit: Payer: Medicare Other | Admitting: Cardiovascular Disease

## 2016-08-06 ENCOUNTER — Ambulatory Visit (INDEPENDENT_AMBULATORY_CARE_PROVIDER_SITE_OTHER): Payer: Medicare Other | Admitting: Cardiovascular Disease

## 2016-08-06 ENCOUNTER — Encounter: Payer: Self-pay | Admitting: Cardiovascular Disease

## 2016-08-06 VITALS — BP 156/83 | HR 78 | Ht 63.5 in | Wt 168.0 lb

## 2016-08-06 DIAGNOSIS — I200 Unstable angina: Secondary | ICD-10-CM

## 2016-08-06 DIAGNOSIS — K5791 Diverticulosis of intestine, part unspecified, without perforation or abscess with bleeding: Secondary | ICD-10-CM

## 2016-08-06 DIAGNOSIS — E782 Mixed hyperlipidemia: Secondary | ICD-10-CM | POA: Diagnosis not present

## 2016-08-06 DIAGNOSIS — I4891 Unspecified atrial fibrillation: Secondary | ICD-10-CM

## 2016-08-06 DIAGNOSIS — I5032 Chronic diastolic (congestive) heart failure: Secondary | ICD-10-CM

## 2016-08-06 DIAGNOSIS — I25700 Atherosclerosis of coronary artery bypass graft(s), unspecified, with unstable angina pectoris: Secondary | ICD-10-CM

## 2016-08-06 DIAGNOSIS — R0602 Shortness of breath: Secondary | ICD-10-CM

## 2016-08-06 DIAGNOSIS — N182 Chronic kidney disease, stage 2 (mild): Secondary | ICD-10-CM

## 2016-08-06 DIAGNOSIS — I10 Essential (primary) hypertension: Secondary | ICD-10-CM

## 2016-08-06 NOTE — Progress Notes (Signed)
Cardiology Office Note  Date:  08/06/2016   ID:  Tabitha Potts, DOB [birth year], MRN [id number]  PCP:  Perrin Maltese, MD   Chief Complaint  Patient presents with  . other    6 month f/u c/o sob and burning/numbing sensation feet/ankles/knees. Meds reviewed verbally with pt.    HPI:  Tabitha Potts is a very pleasant 82 year old woman with a history of hemorrhoidal bleeding who presented to the hospital 06/09/2013 with GI bleeding felt secondary to hemorrhoids and diverticulitis, new onset atrial fibrillation with RVR. It was felt she had developed atrial fibrillation in early October 2014. Prior EKGs earlier in 2014, July, had shown normal sinus rhythm.In the hospital, She was started on Cardizem, digoxin but this was later held. Also started on anticoagulation. Norvasc and ACE inhibitor HCTZ combo was held  for low blood pressure. Creatinine in the hospital 1.47, BUN 26  She presents for routine followup of her chronic atrial fibrillation Not on anticoagulation secondary to GI bleeding CRI Echo 05/2013: normal EF Never smoked  In follow-up today, she reports that labwork done through primary care showed High potassium , she was given kayexelate. Had a difficult time after the treatment, had diarrhea Went to the ER  for weakness, hemorrhoids, 05/22/2016 UTI x 2 Reports that she would rather die than do Kayexalate again  Reports she has been very SOB recently, couple of weeks. Slowly getting worse Some chest tightness on exertion Weight was elevated in September, Now Down 11 pounds from sept 2017 ("had fluid") Does not feel her short of breath is secondary to CHF  CT chest from  01/2014: Images reviewed with her  severe CAD three vessel, moderate diffuse PAD aorta atherosclerosis   Takes lasix 20 mg daily  EKG on today's visit shows atrial fibrillation with ventricular rate 72 bpm, nonspecific ST abnormality  Other past medical history   Not doing regular exercise She's not taking  anticoagulation. Recurrent bleeding as below.  Previously discussed with patient and daughter   She did not want anticoagulation  Previously started on amiodarone in an attempt to pharmacologically cardiovert her to normal sinus rhythm. She remained in atrial fibrillation.   she underwent cardioversion on September 28 2013 which was successful in restoring normal sinus rhythm Followup in clinic after episode of insomnia, a throbbing in her head. She was in normal sinus rhythm On her way into the clinic, she hit her foot on her walker and suffered a large hematoma.  Swelling got worse that night and she had severe pain that was 10 over 10. She went to the emergency room but was sent back home. Bruising/hematoma  resolved. Also reports having significant stress when her son-in-law was visiting. He was eating everything in the house She converted back to atrial fibrillation during these events.    April 2015, she had a recurrent hemorrhoidal/diverticuli bleed. eliquis was held at the time of her discharge from the hospital. She saw Dr. Aleen Sells   She elected to restart her eliquis 2.5 mg twice a day. She did take an occasional 5 mg  On this regimen, she had recurrent bleed . She was admitted to the hospital 02/17/2014. No significant change in her hematocrit.  Colonoscopy was performed . Uncertain if bleeding was from internal hemorrhoids or diverticuli .  Echocardiogram in the hospital 06/09/2013 shows ejection fraction 60-65%, normal right ventricular systolic pressure, mildly dilated left atrium  Hematocrit 06/10/2013 was 35   Since stress test in 2009  Echocardiogram 04/13/2013 with same results  as above   PMH:   has a past medical history of A-fib ([place]); Anginal pain ([place]); Cancer ([place]); Depression; Diverticulosis; Dysrhythmia; GERD (gastroesophageal reflux disease); Hematochezia; Hemorrhoids; Hyperlipidemia; Hypertension; Orthopnea; Osteoarthritis; Palpitations; Shortness of breath  dyspnea; Swelling; Syncope; and Wheezing.  PSH:    Past Surgical History:  Procedure Laterality Date  . ABDOMINAL HYSTERECTOMY    . BACK SURGERY     lumbar,neck  . CARDIOVERSION  2015  . CATARACT EXTRACTION W/PHACO Left 01/08/2015   Procedure: CATARACT EXTRACTION PHACO AND INTRAOCULAR LENS PLACEMENT (IOC);  Surgeon: Birder Robson, MD;  Location: [facility] ORS;  Service: Ophthalmology;  Laterality: Left;  [place] 01:02 AP% 21.8 CDE 13.69  . COLONOSCOPY  2015  . EYE SURGERY     cataract  . FOOT SURGERY    . JOINT REPLACEMENT     thr,tkr  . KNEE SURGERY    . NECK SURGERY    . TOTAL HIP ARTHROPLASTY     right    Current Outpatient Prescriptions  Medication Sig Dispense Refill  . acetaminophen (TYLENOL) 325 MG tablet Take 650 mg by mouth every 6 (six) hours as needed.    . ALPRAZolam (XANAX) 0.25 MG tablet Take 0.25 mg by mouth at bedtime as needed.     Marland Kitchen aspirin EC 81 MG tablet Take 1 tablet (81 mg total) by mouth daily. 90 tablet 3  . diltiazem (DILACOR XR) 240 MG 24 hr capsule TAKE ONE (1) CAPSULE EACH DAY. 30 capsule 3  . furosemide (LASIX) 20 MG tablet TAKE 1 TABLET TWICE A DAY AS NEEDED 60 tablet 3  . hydrocortisone (ANUSOL-HC) 2.5 % rectal cream Apply rectally 2 times daily 30 g 1  . meclizine (ANTIVERT) 25 MG tablet Take 25 mg by mouth 3 (three) times daily as needed.     . Multiple Vitamin (MULTIVITAMIN) tablet Take 1 tablet by mouth daily.    . nitroGLYCERIN (NITROSTAT) 0.4 MG SL tablet Place 1 tablet (0.4 mg total) under the tongue every 5 (five) minutes as needed for chest pain. 25 tablet 3  . olmesartan (BENICAR) 40 MG tablet Take 40 mg by mouth daily.    Marland Kitchen PREMARIN vaginal cream Place 1 Applicatorful vaginally as needed.     . propranolol (INDERAL) 20 MG tablet Take 1 tablet (20 mg total) by mouth 3 (three) times daily as needed. 90 tablet 1  . psyllium (REGULOID) 0.52 G capsule Take 0.52 g by mouth every other day.    . rosuvastatin (CRESTOR) 10 MG tablet Take 10 mg by mouth  daily.     No current facility-administered medications for this visit.      Allergies:   Celebrex [celecoxib]; Codeine; Gabapentin; Ivp dye [iodinated diagnostic agents]; Keflex [cephalexin]; Penicillins; and Sulfa antibiotics   Social History:  The patient  reports that she has never smoked. She has never used smokeless tobacco. She reports that she does not drink alcohol or use drugs.   Family History:   family history includes Heart disease in her son; Hypertension in her son; Kidney cancer in her brother.    Review of Systems: Review of Systems  Constitutional: Negative.   Respiratory: Positive for shortness of breath.   Cardiovascular: Positive for chest pain.  Gastrointestinal: Negative.   Musculoskeletal: Negative.   Neurological: Negative.   Psychiatric/Behavioral: Negative.   All other systems reviewed and are negative.    PHYSICAL EXAM: VS:  BP (!) 156/83 (BP Location: Left Arm, Patient Position: Sitting, Cuff Size: Normal)   Pulse 78  Ht 5' 3.5" (1.613 m)   Wt 168 lb (76.2 kg)   BMI 29.29 kg/m  , BMI Body mass index is 29.29 kg/m. GEN: Well nourished, well developed, in no acute distress, obese  HEENT: normal  Neck: no JVD, carotid bruits, or masses Cardiac: RRR; no murmurs, rubs, or gallops, trace pitting ankle edema bilaterally Respiratory:  clear to auscultation bilaterally, normal work of breathing GI: soft, nontender, nondistended, + BS MS: no deformity or atrophy  Skin: warm and dry, no rash Neuro:  Strength and sensation are intact Psych: euthymic mood, full affect    Recent Labs: 05/22/2016: BUN 16; Creatinine, Ser 1.48; Hemoglobin 13.9; Platelets 282; Potassium 3.7; Sodium 131    Lipid Panel Lab Results  Component Value Date   CHOL 148 06/09/2013   HDL 52 06/09/2013   LDLCALC 77 06/09/2013   TRIG 97 06/09/2013      Wt Readings from Last 3 Encounters:  08/06/16 168 lb (76.2 kg)  05/27/16 170 lb (77.1 kg)  05/22/16 179 lb (81.2 kg)        ASSESSMENT AND PLAN:  Atrial fibrillation, unspecified type ([place]) - Plan: EKG 12-Lead Chronic atrial fibrillation, rate well controlled, not on anticoagulation given GI lead history  Mixed hyperlipidemia Recommended that she continue on her statin, Crestor Goal LDL less than 70  Coronary artery disease involving coronary bypass graft of native heart with unstable angina pectoris (HCC) Severe coronary disease all 3 vessels seen on CT scan chest 2015 Worsening shortness of breath, some chest tightness on exertion Discussed various treatment options with her at length. She does not want stress testing given previous severe reaction in the past to pharmacologic Myoview.  She reports having her blood pressure "bottom out", very unpleasant experience Prefers cardiac catheterization. She would like to do this when her daughter is in town, possibly in January 2018. I have reviewed the risks, indications, and alternatives to cardiac catheterization, possible angioplasty, and stenting with the patient. Risks include but are not limited to bleeding, infection, vascular injury, stroke, myocardial infection, arrhythmia, kidney injury, radiation-related injury in the case of prolonged fluoroscopy use, emergency cardiac surgery, and death. The patient understands the risks of serious complication is 1-2 in [id number] with diagnostic cardiac cath and 1-2% or less with angioplasty/stenting.   Stage 2 chronic kidney disease We will check BMP prior to cardiac catheterization  Gastrointestinal hemorrhage associated with intestinal diverticulosis Prior GI bleed, not on anticoagulation. Also with hemorrhoids  Shortness of breath Worsening shortness of breath, some chest tightness. Secondary to unstable angina Workup as above  Chronic diastolic CHF (congestive heart failure) ([place]) Recommended that she stay on her Lasix, take extra for leg edema  Benign essential hypertension Blood pressure elevated on  today's visit. She reports she is well controlled at home Recommended she monitor blood pressure,: Office if this runs high   Total encounter time more than 45 minutes  Greater than 50% was spent in counseling and coordination of care with the patient   Disposition:   F/U  3 months   Orders Placed This Encounter  Procedures  . EKG 12-Lead     Signed, Esmond Plants, M.D., Ph.D. 08/06/2016  [place], [place]

## 2016-08-06 NOTE — Patient Instructions (Addendum)
Medication Instructions:   No medication changes made  Labwork:  No new labs needed  Testing/Procedures:  We will schedule a cardiac cath in January We will call you after christmas to check the timing You will need to come in for labs at least 2 weeks before the date of your procedure  I recommend watching educational videos on topics of interest to you at:       [email or web address]  Enter code: [email or web address]    Follow-Up: It was a pleasure seeing you in the office today. Please call us if you have new issues that need to be addressed before your next appt.  [phone]  Your physician wants you to follow-up in: 6 months.  You will receive a reminder letter in the mail two months in advance. If you don't receive a letter, please call our office to schedule the follow-up appointment.  If you need a refill on your cardiac medications before your next appointment, please call your pharmacy.     Angiogram An angiogram is an X-ray test. It is used to look at your blood vessels. For this test, a dye is put into the blood vessel being checked. The dye shows up on X-rays. It helps your doctor see if there is a blockage or other problem in the blood vessel. What happens before the procedure?  Follow your doctor's instructions about limiting what you eat or drink.  Ask your doctor if you may drink enough water to take any needed medicines the morning of the test.  Plan to have someone take you home after the test.  If you go home the same day as the test, plan to have someone stay with you for 24 hours. What happens during the procedure?  An IV tube will be put into one of your veins.  You will be given a medicine that makes you relax (sedative).  Your skin will be washed where the thin tube (catheter) will be put in. Hair may be removed from this area. The tube may be put into:  Your upper leg area (groin).  The fold of your arm, near your elbow.  Your wrist.  You will be  given a medicine that numbs the area where the tube will be inserted (local anesthetic).  The tube will be inserted into a blood vessel.  Using a type of X-ray (fluoroscopy) to see, your doctor will move the tube into the blood vessel to check it.  Dye will be put in through the tube. X-rays of your blood vessels will then be taken. Different doctors and hospitals may do this procedure differently. What happens after the procedure?  If the test is done through the leg, you will be kept in bed lying flat for several hours. You will be told to not bend or cross your legs.  The area where the tube was inserted will be checked often.  The pulse in your feet or wrist will be checked often.  More tests or X-rays may be done. This information is not intended to replace advice given to you by your health care provider. Make sure you discuss any questions you have with your health care provider. Document Released: 11/06/2008 Document Revised: 01/16/2016 Document Reviewed: 01/11/2013 Elsevier Interactive Patient Education  2017 [employer] These instructions give you information about caring for yourself after your procedure. Your doctor may also give you more specific instructions. Call your doctor if you have any problems or questions after your procedure. Follow  these instructions at home:  Take medicines only as told by your doctor.  Follow your doctor's instructions about:  Care of the area where the tube was inserted.  Bandage (dressing) changes and removal.  You may shower 24-48 hours after the procedure or as told by your doctor.  Do not take baths, swim, or use a hot tub until your doctor approves.  Every day, check the area where the tube was inserted. Watch for:  Redness, swelling, or pain.  Fluid, blood, or pus.  Do not apply powder or lotion to the site.  Do not lift anything that is heavier than 10 lb (4.5 kg) for 5 days or as told by your  doctor.  Ask your doctor when you can:  Return to work or school.  Do physical activities or play sports.  Have sex.  Do not drive or operate heavy machinery for 24 hours or as told by your doctor.  Have someone with you for the first 24 hours after the procedure.  Keep all follow-up visits as told by your doctor. This is important. Contact a health care provider if:  You have a fever.  You have chills.  You have more bleeding from the area where the tube was inserted. Hold pressure on the area.  You have redness, swelling, or pain in the area where the tube was inserted.  You have fluid or pus coming from the area. Get help right away if:  You have a lot of pain in the area where the tube was inserted.  The area where the tube was inserted is bleeding, and the bleeding does not stop after 30 minutes of holding steady pressure on the area.  The area near or just beyond the insertion site becomes pale, cool, tingly, or numb. This information is not intended to replace advice given to you by your health care provider. Make sure you discuss any questions you have with your health care provider. Document Released: 11/06/2008 Document Revised: 01/16/2016 Document Reviewed: 01/11/2013 Elsevier Interactive Patient Education  2017 [employer].

## 2016-08-07 ENCOUNTER — Telehealth: Payer: Self-pay | Admitting: Cardiovascular Disease

## 2016-08-07 ENCOUNTER — Telehealth: Payer: Self-pay | Admitting: Internal Medicine

## 2016-08-07 NOTE — Telephone Encounter (Signed)
Arie Sabina   08/07/16 2:32 PM    She wants to talk to you about the cath and having a second opinion for her mother.

## 2016-08-07 NOTE — Telephone Encounter (Signed)
Spoke w/ pt's daughter, June. She asks that I review pt's ov yesterday and explain to her why pt needs a cardiac cath. She states that pt recently stayed w/ her for over a month and did not c/o SOB or chest tightness. She asks why Dr. Rockey Situ reviewed results w/ her from 2015. Advised her that pt came w/ physical complaints that our office took seriously. After some discussion, she is agreeable to allowing pt to proceed, but will only be available the week of 1/2-08/28/16. Advised her that I will make Dr. Rockey Situ aware; she asks that I call her back directly w/ date & time of procedure.

## 2016-08-07 NOTE — Telephone Encounter (Signed)
Whether to have a procedure is certainly her choice Patient declined having stress tests She has symptoms concerning for angina CT scan chest with severe three-vessel coronary calcification Given the above, cardiac catheterization was scheduled Patient can certainly decline and we can treat her medically if she would like

## 2016-08-07 NOTE — Telephone Encounter (Signed)
Spoke w/ pt's daughter, June. She states that she called pt to discuss possible date for cath and she became upset. Pt and her daughter would like for pt to get a 2nd opinion before proceeding w/ cath. She would like to p/u pt's records on Monday, as they are trying to get an appt w/ another provider next week. She will call back to schedule cath w/ Dr. Rockey Situ if they feel this is necessary. Call transferred to front desk to advised her of process of picking up records.

## 2016-08-07 NOTE — Telephone Encounter (Signed)
Please see previous note.

## 2016-08-07 NOTE — Telephone Encounter (Signed)
Patient was seen yesterday and the daughter wants to go over ov plan of care.  Patient stated she may be having a procedure and daughter has some questions and concerns please call .  Daughter wants to make sure this is accurate relay of information from patient .  [phone] office   [phone] cell

## 2016-08-07 NOTE — Telephone Encounter (Signed)
She wants to talk to you about the cath and having a second opinion for her mother.

## 2016-08-21 NOTE — Telephone Encounter (Signed)
Error- please disregard

## 2016-08-27 ENCOUNTER — Ambulatory Visit: Payer: Medicare Other | Admitting: Cardiovascular Disease

## 2016-12-15 ENCOUNTER — Other Ambulatory Visit: Payer: Self-pay | Admitting: *Deleted

## 2016-12-15 MED ORDER — FUROSEMIDE 20 MG PO TABS: 20.0000 mg | ORAL_TABLET | Freq: Two times a day (BID) | ORAL | 3 refills | Status: AC | PRN

## 2017-03-30 ENCOUNTER — Telehealth: Payer: Self-pay | Admitting: Cardiovascular Disease

## 2017-03-30 NOTE — Telephone Encounter (Signed)
Patient fu with [facility] she needed 5 stents   deleting recall.

## 2017-12-06 ENCOUNTER — Other Ambulatory Visit: Payer: Self-pay

## 2017-12-06 ENCOUNTER — Emergency Department
Admission: EM | Admit: 2017-12-06 | Discharge: 2017-12-06 | Disposition: A | Discharge status: Home / Self Care | Payer: Medicare Other | Attending: Emergency Medicine | Admitting: Emergency Medicine

## 2017-12-06 DIAGNOSIS — Z79899 Other long term (current) drug therapy: Secondary | ICD-10-CM | POA: Diagnosis not present

## 2017-12-06 DIAGNOSIS — K625 Hemorrhage of anus and rectum: Secondary | ICD-10-CM | POA: Insufficient documentation

## 2017-12-06 DIAGNOSIS — Z7902 Long term (current) use of antithrombotics/antiplatelets: Secondary | ICD-10-CM | POA: Insufficient documentation

## 2017-12-06 DIAGNOSIS — Z96651 Presence of right artificial knee joint: Secondary | ICD-10-CM | POA: Diagnosis not present

## 2017-12-06 DIAGNOSIS — Z96641 Presence of right artificial hip joint: Secondary | ICD-10-CM | POA: Diagnosis not present

## 2017-12-06 DIAGNOSIS — Z7982 Long term (current) use of aspirin: Secondary | ICD-10-CM | POA: Insufficient documentation

## 2017-12-06 DIAGNOSIS — I130 Hypertensive heart and chronic kidney disease with heart failure and stage 1 through stage 4 chronic kidney disease, or unspecified chronic kidney disease: Secondary | ICD-10-CM | POA: Insufficient documentation

## 2017-12-06 DIAGNOSIS — M542 Cervicalgia: Secondary | ICD-10-CM | POA: Diagnosis not present

## 2017-12-06 DIAGNOSIS — I5032 Chronic diastolic (congestive) heart failure: Secondary | ICD-10-CM | POA: Diagnosis not present

## 2017-12-06 DIAGNOSIS — Z8522 Personal history of malignant neoplasm of nasal cavities, middle ear, and accessory sinuses: Secondary | ICD-10-CM | POA: Diagnosis not present

## 2017-12-06 DIAGNOSIS — G8929 Other chronic pain: Secondary | ICD-10-CM | POA: Insufficient documentation

## 2017-12-06 DIAGNOSIS — N189 Chronic kidney disease, unspecified: Secondary | ICD-10-CM | POA: Insufficient documentation

## 2017-12-06 LAB — CBC WITH DIFFERENTIAL/PLATELET
BASOS ABS: 0 10*3/uL (ref 0–0.1)
BASOS PCT: 0 %
Eosinophils Absolute: 0.1 10*3/uL (ref 0–0.7)
Eosinophils Relative: 1 %
HEMATOCRIT: 43.4 % (ref 35.0–47.0)
HEMOGLOBIN: 14.5 g/dL (ref 12.0–16.0)
Lymphocytes Relative: 10 %
Lymphs Abs: 0.8 10*3/uL — ABNORMAL LOW (ref 1.0–3.6)
MCH: 33.3 pg (ref 26.0–34.0)
MCHC: 33.4 g/dL (ref 32.0–36.0)
MCV: 99.7 fL (ref 80.0–100.0)
Monocytes Absolute: 0.5 10*3/uL (ref 0.2–0.9)
Monocytes Relative: 7 %
NEUTROS ABS: 6.7 10*3/uL — AB (ref 1.4–6.5)
NEUTROS PCT: 82 %
Platelets: 264 10*3/uL (ref 150–440)
RBC: 4.36 MIL/uL (ref 3.80–5.20)
RDW: 14 % (ref 11.5–14.5)
WBC: 8.2 10*3/uL (ref 3.6–11.0)

## 2017-12-06 LAB — COMPREHENSIVE METABOLIC PANEL
ALBUMIN: 4.2 g/dL (ref 3.5–5.0)
ALK PHOS: 54 U/L (ref 38–126)
ALT: 14 U/L (ref 14–54)
AST: 29 U/L (ref 15–41)
Anion gap: 8 (ref 5–15)
BUN: 16 mg/dL (ref 6–20)
CO2: 29 mmol/L (ref 22–32)
CREATININE: 1.38 mg/dL — AB (ref 0.44–1.00)
Calcium: 9.2 mg/dL (ref 8.9–10.3)
Chloride: 97 mmol/L — ABNORMAL LOW (ref 101–111)
GFR calc Af Amer: 38 mL/min — ABNORMAL LOW (ref >60)
GFR calc non Af Amer: 33 mL/min — ABNORMAL LOW (ref >60)
GLUCOSE: 106 mg/dL — AB (ref 65–99)
POTASSIUM: 3.9 mmol/L (ref 3.5–5.1)
Sodium: 134 mmol/L — ABNORMAL LOW (ref 135–145)
TOTAL PROTEIN: 7.1 g/dL (ref 6.5–8.1)
Total Bilirubin: 0.6 mg/dL (ref 0.3–1.2)

## 2017-12-06 MED ORDER — MAGNESIUM HYDROXIDE 400 MG/5ML PO SUSP: 30.0000 mL | Freq: Every day | ORAL | 0 refills | Status: AC | PRN

## 2017-12-06 MED ORDER — PHENYLEPHRINE-MINERAL OIL-PET 0.25-14-74.9 % RE OINT: 1.0000 "application " | TOPICAL_OINTMENT | Freq: Two times a day (BID) | RECTAL | 0 refills | Status: AC | PRN

## 2017-12-06 NOTE — ED Notes (Signed)
Family at bedside. 

## 2017-12-06 NOTE — ED Notes (Signed)
Patient arrived EMS with rectal bleeding beginning Thursday gradually getting heavier with each day. Patient reported history of hemorrhoids.

## 2017-12-06 NOTE — ED Provider Notes (Addendum)
[facility] Emergency Department Provider Note  ____________________________________________   First MD Initiated Contact with Patient 12/06/17 [phone]     (approximate)  I have reviewed the triage vital signs and the nursing notes.   HISTORY  Chief Complaint Rectal Bleeding   HPI Tabitha Potts is a 84 y.o. female with a history of atrial fibrillation on Plavix as well as aspirin who is presenting to the emergency department today with bright red blood per rectum throughout the night.  She says that the blood has been bright red.  Not reporting any clots.  Says that she often has to push hard with her stool and has had hemorrhoid bleeds in the past which have been similar.  Other than that she is complaining of her chronic neck pain which is unchanged.  Past Medical History:  Diagnosis Date  . A-fib ([place])   . Anginal pain ([place])   . Cancer ([place])    history nose  . Depression   . Diverticulosis   . Dysrhythmia    afib  . GERD (gastroesophageal reflux disease)   . Hematochezia   . Hemorrhoids   . Hyperlipidemia   . Hypertension   . Orthopnea   . Osteoarthritis    ra  . Palpitations   . Shortness of breath dyspnea   . Swelling   . Syncope   . Wheezing     Patient Active Problem List   Diagnosis Date Noted  . Basal cell carcinoma 05/27/2016  . Enthesopathy of hip region 05/27/2016  . Neuralgia, neuritis, and radiculitis, unspecified 05/27/2016  . Osteoarthrosis, unspecified whether generalized or localized, pelvic region and thigh 05/27/2016  . Rheumatoid arthritis ([place]) 05/27/2016  . Synovitis and tenosynovitis 05/27/2016  . Inflammatory polyarthropathy ([place]) 05/27/2016  . Shortness of breath 01/01/2016  . Abdominal pain 07/02/2015  . Chronic diastolic CHF (congestive heart failure) ([place]) 02/09/2014  . GI bleed 02/08/2014  . Unstable gait 12/14/2013  . Hematoma of lower limb 09/29/2013  . Atrial fibrillation ([place]) 06/20/2013  . Benign  essential hypertension 06/20/2013  . Leg edema 06/20/2013  . Chronic kidney disease 06/20/2013  . Hyperlipidemia, unspecified 06/20/2013    Past Surgical History:  Procedure Laterality Date  . ABDOMINAL HYSTERECTOMY    . BACK SURGERY     lumbar,neck  . CARDIOVERSION  2015  . CATARACT EXTRACTION W/PHACO Left 01/08/2015   Procedure: CATARACT EXTRACTION PHACO AND INTRAOCULAR LENS PLACEMENT (IOC);  Surgeon: Birder Robson, MD;  Location: [facility] ORS;  Service: Ophthalmology;  Laterality: Left;  [place] 01:02 AP% 21.8 CDE 13.69  . COLONOSCOPY  2015  . EYE SURGERY     cataract  . FOOT SURGERY    . JOINT REPLACEMENT     thr,tkr  . KNEE SURGERY    . NECK SURGERY    . TOTAL HIP ARTHROPLASTY     right    Prior to Admission medications   Medication Sig Start Date End Date Taking? Authorizing Provider  acetaminophen (TYLENOL) 325 MG tablet Take 650 mg by mouth every 6 (six) hours as needed.    [provider]  ALPRAZolam Duanne Moron) 0.25 MG tablet Take 0.25 mg by mouth at bedtime as needed.  06/10/13   [provider]  aspirin EC 81 MG tablet Take 1 tablet (81 mg total) by mouth daily. 02/22/14   [provider]  baclofen (LIORESAL) 10 MG tablet Take 1 tablet by mouth daily as needed. 11/09/17   [provider]  clopidogrel (PLAVIX) 75 MG  tablet Take 1 tablet by mouth daily. 11/18/17   [provider]  diltiazem (DILACOR XR) 240 MG 24 hr capsule TAKE ONE (1) CAPSULE EACH DAY. 12/06/15   Minna Merritts, MD  furosemide (LASIX) 20 MG tablet Take 1 tablet (20 mg total) by mouth 2 (two) times daily as needed. 12/15/16   Minna Merritts, MD  meclizine (ANTIVERT) 25 MG tablet Take 25 mg by mouth 3 (three) times daily as needed.  02/14/14   [provider]  Multiple Vitamin (MULTIVITAMIN) tablet Take 1 tablet by mouth daily.    [provider]  nitroGLYCERIN (NITROSTAT) 0.4 MG SL tablet Place 1 tablet (0.4 mg total) under the tongue every 5 (five)  minutes as needed for chest pain. 01/07/15   Minna Merritts, MD  olmesartan (BENICAR) 40 MG tablet Take 40 mg by mouth daily.    [provider]  PREMARIN vaginal cream Place 1 Applicatorful vaginally as needed.  02/26/14   [provider]  propranolol (INDERAL) 20 MG tablet Take 1 tablet (20 mg total) by mouth 3 (three) times daily as needed. 01/07/15   Minna Merritts, MD  psyllium (REGULOID) 0.52 G capsule Take 0.52 g by mouth every other day.    [provider]  rosuvastatin (CRESTOR) 10 MG tablet Take 10 mg by mouth daily.    [provider]    Allergies Celebrex [celecoxib]; Codeine; Gabapentin; Ivp dye [iodinated diagnostic agents]; Keflex [cephalexin]; Penicillins; and Sulfa antibiotics  Family History  Problem Relation Age of Onset  . Heart disease Son   . Hypertension Son   . Kidney cancer Brother   . Bladder Cancer Neg Hx     Social History Social History   Tobacco Use  . Smoking status: Never Smoker  . Smokeless tobacco: Never Used  Substance Use Topics  . Alcohol use: No  . Drug use: No    Review of Systems  Constitutional: No fever/chills Eyes: No visual changes. ENT: No sore throat. Cardiovascular: Denies chest pain. Respiratory: Denies shortness of breath. Gastrointestinal: No abdominal pain.  No nausea, no vomiting.  No diarrhea.  No constipation. Genitourinary: Negative for dysuria. Musculoskeletal: Negative for back pain. Skin: Negative for rash. Neurological: Negative for headaches, focal weakness or numbness.   ____________________________________________   PHYSICAL EXAM:  VITAL SIGNS: ED Triage Vitals  Enc Vitals Group     BP 12/06/17 0927 (!) 176/107     Pulse Rate 12/06/17 0941 94     Resp 12/06/17 0941 (!) 24     Temp 12/06/17 0941 (!) 97.5 F (36.4 C)     Temp Source 12/06/17 0941 Oral     SpO2 12/06/17 0939 97 %     Weight 12/06/17 0943 163 lb (73.9 kg)     Height 12/06/17 0943 5\' 3"  (1.6 m)      Head Circumference --      Peak Flow --      Pain Score 12/06/17 0934 0     Pain Loc --      Pain Edu? --      Excl. in [place]? --     Constitutional: Alert and oriented. Well appearing and in no acute distress. Eyes: Conjunctivae are normal.  Head: Atraumatic. Nose: No congestion/rhinnorhea. Mouth/Throat: Mucous membranes are moist.  Neck: No stridor.   Cardiovascular: Normal rate, regular rhythm. Grossly normal heart sounds.   Respiratory: Normal respiratory effort.  No retractions. Lungs CTAB. Gastrointestinal: Soft and nontender. No distention. No CVA tenderness.  Rectal exam with multiple non-engorged hemorrhoids.  Digital exam with brown stool which is heme-negative. Musculoskeletal: Moderate edema to the bilateral lower extremities. Neurologic:  Normal speech and language. No gross focal neurologic deficits are appreciated. Skin:  Skin is warm, dry and intact. No rash noted. Psychiatric: Mood and affect are normal. Speech and behavior are normal.  ____________________________________________   LABS (all labs ordered are listed, but only abnormal results are displayed)  Labs Reviewed  CBC WITH DIFFERENTIAL/PLATELET - Abnormal; Notable for the following components:      Result Value   Neutro Abs 6.7 (*)    Lymphs Abs 0.8 (*)    All other components within normal limits  COMPREHENSIVE METABOLIC PANEL - Abnormal; Notable for the following components:   Sodium 134 (*)    Chloride 97 (*)    Glucose, Bld 106 (*)    Creatinine, Ser 1.38 (*)    GFR calc non Af Amer 33 (*)    GFR calc Af Amer 38 (*)    All other components within normal limits  TYPE AND SCREEN   ____________________________________________  EKG   ____________________________________________  RADIOLOGY   ____________________________________________   PROCEDURES  Procedure(s) performed:  Procedures  Critical Care performed:   ____________________________________________   INITIAL IMPRESSION  / ASSESSMENT AND PLAN / ED COURSE  Pertinent labs & imaging results that were available during my care of the patient were reviewed by me and considered in my medical decision making (see chart for details).  DDX: Lower GI bleeding, hemorrhoid bleed, anemia, diverticulosis As part of my medical decision making, I reviewed the following data within the electronic MEDICAL RECORD NUMBER Notes from prior ED visits  ----------------------------------------- 11:54 AM on 12/06/2017 -----------------------------------------  Labs at baseline patient says that her bleeding is stopped.  Likely hemorrhoid bleed.  Patient also concerned about not feeling well after starting BuSpar 2 weeks ago and being weaned from her Xanax.  Says that she is try to call her primary care doctor for this issue but did not receive a call back.  I stressed the importance of calling her primary care doctor for further medication adjustments.  Patient also has not taken any of her medications this morning and is slightly hypertensive but A. fib is still rate controlled at this time.  Will discharge with Preparation H as well as milk of magnesia.  Patient understanding of the plan willing to comply.  But not "feeling right" the patient states that she says that she feels nervous "inside."  However, she has been off of her Xanax for several weeks now is not showing signs of acute withdrawal. ____________________________________________   FINAL CLINICAL IMPRESSION(S) / ED DIAGNOSES  GI bleeding, likely hemorrhoids    NEW MEDICATIONS STARTED DURING THIS VISIT:  New Prescriptions   No medications on file     Note:  This document was prepared using Dragon voice recognition software and may include unintentional dictation errors.     Orbie Pyo, MD 12/06/17 1155    Lynsey Ange, Randall An, MD 12/06/17 [phone]

## 2018-03-20 ENCOUNTER — Encounter: Payer: Self-pay | Admitting: Emergency Medicine

## 2018-03-20 ENCOUNTER — Other Ambulatory Visit: Payer: Self-pay

## 2018-03-20 ENCOUNTER — Ambulatory Visit
Admission: EM | Admit: 2018-03-20 | Discharge: 2018-03-20 | Disposition: A | Discharge status: Home / Self Care | Payer: Medicare Other | Attending: Family Medicine | Admitting: Family Medicine

## 2018-03-20 DIAGNOSIS — Z85828 Personal history of other malignant neoplasm of skin: Secondary | ICD-10-CM | POA: Insufficient documentation

## 2018-03-20 DIAGNOSIS — I5032 Chronic diastolic (congestive) heart failure: Secondary | ICD-10-CM | POA: Insufficient documentation

## 2018-03-20 DIAGNOSIS — N189 Chronic kidney disease, unspecified: Secondary | ICD-10-CM | POA: Diagnosis not present

## 2018-03-20 DIAGNOSIS — N390 Urinary tract infection, site not specified: Secondary | ICD-10-CM | POA: Insufficient documentation

## 2018-03-20 DIAGNOSIS — Z8051 Family history of malignant neoplasm of kidney: Secondary | ICD-10-CM | POA: Insufficient documentation

## 2018-03-20 DIAGNOSIS — Z7902 Long term (current) use of antithrombotics/antiplatelets: Secondary | ICD-10-CM | POA: Insufficient documentation

## 2018-03-20 DIAGNOSIS — I130 Hypertensive heart and chronic kidney disease with heart failure and stage 1 through stage 4 chronic kidney disease, or unspecified chronic kidney disease: Secondary | ICD-10-CM | POA: Insufficient documentation

## 2018-03-20 DIAGNOSIS — F329 Major depressive disorder, single episode, unspecified: Secondary | ICD-10-CM | POA: Insufficient documentation

## 2018-03-20 DIAGNOSIS — Z90710 Acquired absence of both cervix and uterus: Secondary | ICD-10-CM | POA: Diagnosis not present

## 2018-03-20 DIAGNOSIS — E785 Hyperlipidemia, unspecified: Secondary | ICD-10-CM | POA: Diagnosis not present

## 2018-03-20 DIAGNOSIS — R350 Frequency of micturition: Secondary | ICD-10-CM | POA: Diagnosis present

## 2018-03-20 DIAGNOSIS — Z8249 Family history of ischemic heart disease and other diseases of the circulatory system: Secondary | ICD-10-CM | POA: Insufficient documentation

## 2018-03-20 DIAGNOSIS — Z79899 Other long term (current) drug therapy: Secondary | ICD-10-CM | POA: Insufficient documentation

## 2018-03-20 DIAGNOSIS — Z96659 Presence of unspecified artificial knee joint: Secondary | ICD-10-CM | POA: Insufficient documentation

## 2018-03-20 DIAGNOSIS — Z885 Allergy status to narcotic agent status: Secondary | ICD-10-CM | POA: Diagnosis not present

## 2018-03-20 DIAGNOSIS — Z882 Allergy status to sulfonamides status: Secondary | ICD-10-CM | POA: Insufficient documentation

## 2018-03-20 DIAGNOSIS — I4891 Unspecified atrial fibrillation: Secondary | ICD-10-CM | POA: Insufficient documentation

## 2018-03-20 DIAGNOSIS — Z961 Presence of intraocular lens: Secondary | ICD-10-CM | POA: Insufficient documentation

## 2018-03-20 DIAGNOSIS — Z880 Allergy status to penicillin: Secondary | ICD-10-CM | POA: Diagnosis not present

## 2018-03-20 DIAGNOSIS — Z96641 Presence of right artificial hip joint: Secondary | ICD-10-CM | POA: Diagnosis not present

## 2018-03-20 DIAGNOSIS — M069 Rheumatoid arthritis, unspecified: Secondary | ICD-10-CM | POA: Insufficient documentation

## 2018-03-20 DIAGNOSIS — Z7982 Long term (current) use of aspirin: Secondary | ICD-10-CM | POA: Diagnosis not present

## 2018-03-20 DIAGNOSIS — Z9842 Cataract extraction status, left eye: Secondary | ICD-10-CM | POA: Insufficient documentation

## 2018-03-20 DIAGNOSIS — Z881 Allergy status to other antibiotic agents status: Secondary | ICD-10-CM | POA: Insufficient documentation

## 2018-03-20 DIAGNOSIS — Z91041 Radiographic dye allergy status: Secondary | ICD-10-CM | POA: Insufficient documentation

## 2018-03-20 DIAGNOSIS — K219 Gastro-esophageal reflux disease without esophagitis: Secondary | ICD-10-CM | POA: Diagnosis not present

## 2018-03-20 LAB — URINALYSIS, COMPLETE (UACMP) WITH MICROSCOPIC
Bilirubin Urine: NEGATIVE
Glucose, UA: NEGATIVE mg/dL
Hgb urine dipstick: NEGATIVE
Ketones, ur: NEGATIVE mg/dL
Nitrite: NEGATIVE
Protein, ur: NEGATIVE mg/dL
Specific Gravity, Urine: 1.015 (ref 1.005–1.030)
pH: 6.5 (ref 5.0–8.0)

## 2018-03-20 MED ORDER — CIPROFLOXACIN HCL 250 MG PO TABS: 250.0000 mg | ORAL_TABLET | Freq: Two times a day (BID) | ORAL | 0 refills | Status: DC

## 2018-03-20 NOTE — ED Triage Notes (Signed)
Patient c/o urinary frequency, dysuria and odor that started yesterday.

## 2018-03-20 NOTE — Discharge Instructions (Addendum)
Increase fluids.

## 2018-03-20 NOTE — ED Provider Notes (Signed)
[facility]    CSN: [id number] Arrival date & time: 03/20/18  3299     History   Chief Complaint Chief Complaint  Patient presents with  . Urinary Frequency    HPI Tabitha STOGNER is a 84 y.o. female.   The history is provided by the patient.  Dysuria  Pain quality:  Burning Pain severity:  Moderate Onset quality:  Sudden Duration:  1 day Timing:  Constant Progression:  Worsening Chronicity:  New Recent urinary tract infections: no   Relieved by:  None tried Ineffective treatments:  None tried Urinary symptoms: foul-smelling urine and frequent urination   Associated symptoms: no abdominal pain, no fever, no flank pain, no genital lesions, no nausea, no vaginal discharge and no vomiting   Risk factors: no hx of pyelonephritis, no hx of urolithiasis, no kidney transplant, not pregnant, no recurrent urinary tract infections, no renal cysts, no renal disease, not sexually active, no sexually transmitted infections and no single kidney     Past Medical History:  Diagnosis Date  . A-fib ([place])   . Anginal pain ([place])   . Cancer ([place])    history nose  . Depression   . Diverticulosis   . Dysrhythmia    afib  . GERD (gastroesophageal reflux disease)   . Hematochezia   . Hemorrhoids   . Hyperlipidemia   . Hypertension   . Orthopnea   . Osteoarthritis    ra  . Palpitations   . Shortness of breath dyspnea   . Swelling   . Syncope   . Wheezing     Patient Active Problem List   Diagnosis Date Noted  . Basal cell carcinoma 05/27/2016  . Enthesopathy of hip region 05/27/2016  . Neuralgia, neuritis, and radiculitis, unspecified 05/27/2016  . Osteoarthrosis, unspecified whether generalized or localized, pelvic region and thigh 05/27/2016  . Rheumatoid arthritis ([place]) 05/27/2016  . Synovitis and tenosynovitis 05/27/2016  . Inflammatory polyarthropathy ([place]) 05/27/2016  . Shortness of breath 01/01/2016  . Abdominal pain 07/02/2015  . Chronic diastolic CHF  (congestive heart failure) ([place]) 02/09/2014  . GI bleed 02/08/2014  . Unstable gait 12/14/2013  . Hematoma of lower limb 09/29/2013  . Atrial fibrillation ([place]) 06/20/2013  . Benign essential hypertension 06/20/2013  . Leg edema 06/20/2013  . Chronic kidney disease 06/20/2013  . Hyperlipidemia, unspecified 06/20/2013    Past Surgical History:  Procedure Laterality Date  . ABDOMINAL HYSTERECTOMY    . BACK SURGERY     lumbar,neck  . CARDIOVERSION  2015  . CATARACT EXTRACTION W/PHACO Left 01/08/2015   Procedure: CATARACT EXTRACTION PHACO AND INTRAOCULAR LENS PLACEMENT (IOC);  Surgeon: Birder Robson, MD;  Location: [facility] ORS;  Service: Ophthalmology;  Laterality: Left;  [place] 01:02 AP% 21.8 CDE 13.69  . COLONOSCOPY  2015  . EYE SURGERY     cataract  . FOOT SURGERY    . JOINT REPLACEMENT     thr,tkr  . KNEE SURGERY    . NECK SURGERY    . TOTAL HIP ARTHROPLASTY     right    OB History    Gravida  3   Para  3   Term      Preterm      AB      Living  3     SAB      TAB      Ectopic      Multiple      Live Births  Home Medications    Prior to Admission medications   Medication Sig Start Date End Date Taking? Authorizing Provider  acetaminophen (TYLENOL) 325 MG tablet Take 650 mg by mouth every 6 (six) hours as needed.   Yes [provider]  ALPRAZolam (XANAX) 0.25 MG tablet Take 0.25 mg by mouth at bedtime as needed.  06/10/13  Yes [provider]  aspirin EC 81 MG tablet Take 1 tablet (81 mg total) by mouth daily. 02/22/14  Yes [provider]  baclofen (LIORESAL) 10 MG tablet Take 1 tablet by mouth daily as needed. 11/09/17  Yes [provider]  clopidogrel (PLAVIX) 75 MG tablet Take 1 tablet by mouth daily. 11/18/17  Yes [provider]  diltiazem (DILACOR XR) 240 MG 24 hr capsule TAKE ONE (1) CAPSULE EACH DAY. 12/06/15  Yes Gollan, Kathlene November, MD  furosemide (LASIX) 20 MG tablet Take 1 tablet (20 mg  total) by mouth 2 (two) times daily as needed. 12/15/16  Yes Minna Merritts, MD  meclizine (ANTIVERT) 25 MG tablet Take 25 mg by mouth 3 (three) times daily as needed.  02/14/14  Yes [provider]  Multiple Vitamin (MULTIVITAMIN) tablet Take 1 tablet by mouth daily.   Yes [provider]  nitroGLYCERIN (NITROSTAT) 0.4 MG SL tablet Place 1 tablet (0.4 mg total) under the tongue every 5 (five) minutes as needed for chest pain. 01/07/15  Yes Gollan, Kathlene November, MD  olmesartan (BENICAR) 40 MG tablet Take 40 mg by mouth daily.   Yes [provider]  PREMARIN vaginal cream Place 1 Applicatorful vaginally as needed.  02/26/14  Yes [provider]  propranolol (INDERAL) 20 MG tablet Take 1 tablet (20 mg total) by mouth 3 (three) times daily as needed. 01/07/15  Yes Gollan, Kathlene November, MD  psyllium (REGULOID) 0.52 G capsule Take 0.52 g by mouth every other day.   Yes [provider]  rosuvastatin (CRESTOR) 10 MG tablet Take 10 mg by mouth daily.   Yes [provider]  ciprofloxacin (CIPRO) 250 MG tablet Take 1 tablet (250 mg total) by mouth every 12 (twelve) hours. 03/20/18   Norval Gable, MD    Family History Family History  Problem Relation Age of Onset  . Heart disease Son   . Hypertension Son   . Kidney cancer Brother   . Bladder Cancer Neg Hx     Social History Social History   Tobacco Use  . Smoking status: Never Smoker  . Smokeless tobacco: Never Used  Substance Use Topics  . Alcohol use: No  . Drug use: No     Allergies   Celebrex [celecoxib]; Codeine; Gabapentin; Ivp dye [iodinated diagnostic agents]; Keflex [cephalexin]; Penicillins; and Sulfa antibiotics   Review of Systems Review of Systems  Constitutional: Negative for fever.  Gastrointestinal: Negative for abdominal pain, nausea and vomiting.  Genitourinary: Positive for dysuria. Negative for flank pain and vaginal discharge.     Physical Exam Triage Vital  Signs ED Triage Vitals  Enc Vitals Group     BP 03/20/18 0827 (!) 173/93     Pulse Rate 03/20/18 0827 (!) 104     Resp 03/20/18 0827 18     Temp 03/20/18 0827 98.1 F (36.7 C)     Temp Source 03/20/18 0827 Oral     SpO2 03/20/18 0827 100 %     Weight --      Height --      Head Circumference --  Peak Flow --      Pain Score 03/20/18 0825 0     Pain Loc --      Pain Edu? --      Excl. in [place]? --    No data found.  Updated Vital Signs BP (!) 173/93 (BP Location: Right Arm)   Pulse (!) 104   Temp 98.1 F (36.7 C) (Oral)   Resp 18   SpO2 100%   Visual Acuity Right Eye Distance:   Left Eye Distance:   Bilateral Distance:    Right Eye Near:   Left Eye Near:    Bilateral Near:     Physical Exam  Constitutional: She appears well-developed and well-nourished. No distress.  Abdominal: Soft. She exhibits no distension. There is no tenderness.  Skin: She is not diaphoretic.  Nursing note and vitals reviewed.    UC Treatments / Results  Labs (all labs ordered are listed, but only abnormal results are displayed) Labs Reviewed  URINALYSIS, COMPLETE (UACMP) WITH MICROSCOPIC - Abnormal; Notable for the following components:      Result Value   APPearance CLOUDY (*)    Leukocytes, UA SMALL (*)    Bacteria, UA MANY (*)    All other components within normal limits  URINE CULTURE    EKG None  Radiology No results found.  Procedures Procedures (including critical care time)  Medications Ordered in UC Medications - No data to display  Initial Impression / Assessment and Plan / UC Course  I have reviewed the triage vital signs and the nursing notes.  Pertinent labs & imaging results that were available during my care of the patient were reviewed by me and considered in my medical decision making (see chart for details).     Final Clinical Impressions(s) / UC Diagnoses   Final diagnoses:  Lower urinary tract infectious disease     Discharge Instructions      Increase fluids    ED Prescriptions    Medication Sig Dispense Auth. Provider   ciprofloxacin (CIPRO) 250 MG tablet Take 1 tablet (250 mg total) by mouth every 12 (twelve) hours. 10 tablet Norval Gable, MD     1. Lab results and diagnosis reviewed with patient 2. rx as per orders above; reviewed possible side effects, interactions, risks and benefits  3. Recommend supportive treatment with increased fluids 4. Follow-up prn if symptoms worsen or don't improve   Controlled Substance Prescriptions [place] Controlled Substance Registry consulted? Not Applicable   Norval Gable, MD 03/20/18 1155

## 2018-03-21 LAB — URINE CULTURE
Culture: NO GROWTH
Special Requests: NORMAL

## 2019-08-24 ENCOUNTER — Ambulatory Visit
Admission: EM | Admit: 2019-08-24 | Discharge: 2019-08-24 | Disposition: A | Discharge status: Home / Self Care | Payer: Medicare Other | Attending: Emergency Medicine | Admitting: Emergency Medicine

## 2019-08-24 ENCOUNTER — Encounter: Payer: Self-pay | Admitting: Emergency Medicine

## 2019-08-24 ENCOUNTER — Other Ambulatory Visit: Payer: Self-pay

## 2019-08-24 DIAGNOSIS — Z880 Allergy status to penicillin: Secondary | ICD-10-CM | POA: Diagnosis not present

## 2019-08-24 DIAGNOSIS — Z7902 Long term (current) use of antithrombotics/antiplatelets: Secondary | ICD-10-CM | POA: Diagnosis not present

## 2019-08-24 DIAGNOSIS — K219 Gastro-esophageal reflux disease without esophagitis: Secondary | ICD-10-CM | POA: Insufficient documentation

## 2019-08-24 DIAGNOSIS — R319 Hematuria, unspecified: Secondary | ICD-10-CM | POA: Insufficient documentation

## 2019-08-24 DIAGNOSIS — Z8051 Family history of malignant neoplasm of kidney: Secondary | ICD-10-CM | POA: Insufficient documentation

## 2019-08-24 DIAGNOSIS — Z91041 Radiographic dye allergy status: Secondary | ICD-10-CM | POA: Diagnosis not present

## 2019-08-24 DIAGNOSIS — Z881 Allergy status to other antibiotic agents status: Secondary | ICD-10-CM | POA: Diagnosis not present

## 2019-08-24 DIAGNOSIS — N189 Chronic kidney disease, unspecified: Secondary | ICD-10-CM | POA: Insufficient documentation

## 2019-08-24 DIAGNOSIS — I4891 Unspecified atrial fibrillation: Secondary | ICD-10-CM | POA: Diagnosis not present

## 2019-08-24 DIAGNOSIS — Z885 Allergy status to narcotic agent status: Secondary | ICD-10-CM | POA: Insufficient documentation

## 2019-08-24 DIAGNOSIS — R531 Weakness: Secondary | ICD-10-CM | POA: Diagnosis not present

## 2019-08-24 DIAGNOSIS — E785 Hyperlipidemia, unspecified: Secondary | ICD-10-CM | POA: Diagnosis not present

## 2019-08-24 DIAGNOSIS — I129 Hypertensive chronic kidney disease with stage 1 through stage 4 chronic kidney disease, or unspecified chronic kidney disease: Secondary | ICD-10-CM | POA: Diagnosis not present

## 2019-08-24 DIAGNOSIS — Z888 Allergy status to other drugs, medicaments and biological substances status: Secondary | ICD-10-CM | POA: Diagnosis not present

## 2019-08-24 DIAGNOSIS — Z20828 Contact with and (suspected) exposure to other viral communicable diseases: Secondary | ICD-10-CM | POA: Diagnosis not present

## 2019-08-24 DIAGNOSIS — Z8249 Family history of ischemic heart disease and other diseases of the circulatory system: Secondary | ICD-10-CM | POA: Insufficient documentation

## 2019-08-24 DIAGNOSIS — Z886 Allergy status to analgesic agent status: Secondary | ICD-10-CM | POA: Diagnosis not present

## 2019-08-24 DIAGNOSIS — Z7982 Long term (current) use of aspirin: Secondary | ICD-10-CM | POA: Diagnosis not present

## 2019-08-24 DIAGNOSIS — Z882 Allergy status to sulfonamides status: Secondary | ICD-10-CM | POA: Insufficient documentation

## 2019-08-24 DIAGNOSIS — Z96641 Presence of right artificial hip joint: Secondary | ICD-10-CM | POA: Insufficient documentation

## 2019-08-24 DIAGNOSIS — N390 Urinary tract infection, site not specified: Secondary | ICD-10-CM | POA: Insufficient documentation

## 2019-08-24 DIAGNOSIS — D649 Anemia, unspecified: Secondary | ICD-10-CM | POA: Diagnosis not present

## 2019-08-24 DIAGNOSIS — Z79899 Other long term (current) drug therapy: Secondary | ICD-10-CM | POA: Diagnosis not present

## 2019-08-24 LAB — CBC WITH DIFFERENTIAL/PLATELET
Abs Immature Granulocytes: 0.03 10*3/uL (ref 0.00–0.07)
Basophils Absolute: 0.1 10*3/uL (ref 0.0–0.1)
Basophils Relative: 1 %
Eosinophils Absolute: 0.1 10*3/uL (ref 0.0–0.5)
Eosinophils Relative: 1 %
HCT: 39.5 % (ref 36.0–46.0)
Hemoglobin: 11.9 g/dL — ABNORMAL LOW (ref 12.0–15.0)
Immature Granulocytes: 0 %
Lymphocytes Relative: 11 %
Lymphs Abs: 0.8 10*3/uL (ref 0.7–4.0)
MCH: 25.7 pg — ABNORMAL LOW (ref 26.0–34.0)
MCHC: 30.1 g/dL (ref 30.0–36.0)
MCV: 85.3 fL (ref 80.0–100.0)
Monocytes Absolute: 0.5 10*3/uL (ref 0.1–1.0)
Monocytes Relative: 7 %
Neutro Abs: 5.3 10*3/uL (ref 1.7–7.7)
Neutrophils Relative %: 80 %
Platelets: 303 10*3/uL (ref 150–400)
RBC: 4.63 MIL/uL (ref 3.87–5.11)
RDW: 15.9 % — ABNORMAL HIGH (ref 11.5–15.5)
WBC: 6.7 10*3/uL (ref 4.0–10.5)
nRBC: 0 % (ref 0.0–0.2)

## 2019-08-24 LAB — BASIC METABOLIC PANEL
Anion gap: 11 (ref 5–15)
BUN: 18 mg/dL (ref 8–23)
CO2: 28 mmol/L (ref 22–32)
Calcium: 9.1 mg/dL (ref 8.9–10.3)
Chloride: 98 mmol/L (ref 98–111)
Creatinine, Ser: 1.43 mg/dL — ABNORMAL HIGH (ref 0.44–1.00)
GFR calc Af Amer: 37 mL/min — ABNORMAL LOW (ref >60)
GFR calc non Af Amer: 32 mL/min — ABNORMAL LOW (ref >60)
Glucose, Bld: 106 mg/dL — ABNORMAL HIGH (ref 70–99)
Potassium: 4.4 mmol/L (ref 3.5–5.1)
Sodium: 137 mmol/L (ref 135–145)

## 2019-08-24 LAB — URINALYSIS, COMPLETE (UACMP) WITH MICROSCOPIC
Bilirubin Urine: NEGATIVE
Glucose, UA: NEGATIVE mg/dL
Ketones, ur: NEGATIVE mg/dL
Nitrite: NEGATIVE
Protein, ur: NEGATIVE mg/dL
Specific Gravity, Urine: 1.015 (ref 1.005–1.030)
pH: 7 (ref 5.0–8.0)

## 2019-08-24 LAB — GLUCOSE, CAPILLARY: Glucose-Capillary: 93 mg/dL (ref 70–99)

## 2019-08-24 LAB — SARS CORONAVIRUS 2 AG (30 MIN TAT): SARS Coronavirus 2 Ag: NEGATIVE

## 2019-08-24 MED ORDER — CIPROFLOXACIN HCL 250 MG PO TABS: 250.0000 mg | ORAL_TABLET | ORAL | 0 refills | Status: AC

## 2019-08-24 NOTE — Discharge Instructions (Addendum)
It appears that you have a urinary tract infection.  I have sent your urine off for culture to confirm this diagnosis and to make sure that you on the right antibiotic.  I am sending you home on Cipro, which you have had before.  Make sure you drink plenty of electrolyte containing fluids such as Pedialyte.  Your urine should be clear.  Urinary tract infections can be dangerous, especially in older people, so be vigilant about fevers above 100.4, abdominal pain, back pain, worsening weakness.  To the ER for the signs and symptoms we discussed.  Please follow-up with your doctor in 3 days to make sure that you are getting better.

## 2019-08-24 NOTE — ED Triage Notes (Signed)
Patient states she woke up this morning "not feeling well." She states she is very weak and feels like her eyes can't focus. She denies urinary symptoms. She does live alone.

## 2019-08-24 NOTE — ED Provider Notes (Signed)
HPI  SUBJECTIVE:  Tabitha Potts is a 85 y.o. female who presents with generalized weakness starting this morning.  She states that she woke up "feeling bad".  States that she had trouble focusing on the television earlier today, but was able to focus on other objects without any problem.  She denies visual loss, other visual changes.  No fevers, nasal congestion, body aches, headaches, sore throat, loss of sense of taste or smell.  No coughing, wheezing, chest pain, shortness of breath, palpitations.  No nausea, vomiting, diarrhea, abdominal pain.  No dysuria, urgency, frequency, cloudy or odorous urine, hematuria.  She has some hemorrhoidal bleeding last week, but none recently.  Denies melena, hematochezia, epistaxis, vaginal bleeding, hemoptysis.  No back pain.  No recent change in her medications.  Prescribed Xanax, but states that she did not take any last night.  No known exposure to Covid or flu.  No arm or leg weakness, facial droop, slurred speech.  No dizziness, presyncope, syncope.  She tried a cold compress without improvement in her symptoms.  No other aggravating or alleviating factors.  She has a past medical history of chronic kidney disease, hypertension, hypercholesterolemia, GI bleed, atrial fibrillation on Plavix.  No history of MI, stroke, aneurysm, nephrolithiasis.  She has a history of UTIs, pyelonephritis and states that she felt like this when she had a kidney infection before.  No history of nephrolithiasis.  PMD: Dr. Iona Beard  Past Medical History:  Diagnosis Date  . A-fib ([place])   . Anginal pain ([place])   . Cancer ([place])    history nose  . Depression   . Diverticulosis   . Dysrhythmia    afib  . GERD (gastroesophageal reflux disease)   . Hematochezia   . Hemorrhoids   . Hyperlipidemia   . Hypertension   . Orthopnea   . Osteoarthritis    ra  . Palpitations   . Shortness of breath dyspnea   . Swelling   . Syncope   . Wheezing     Past Surgical History:  Procedure  Laterality Date  . ABDOMINAL HYSTERECTOMY    . BACK SURGERY     lumbar,neck  . CARDIOVERSION  2015  . CATARACT EXTRACTION W/PHACO Left 01/08/2015   Procedure: CATARACT EXTRACTION PHACO AND INTRAOCULAR LENS PLACEMENT (IOC);  Surgeon: Birder Robson, MD;  Location: [facility] ORS;  Service: Ophthalmology;  Laterality: Left;  [place] 01:02 AP% 21.8 CDE 13.69  . COLONOSCOPY  2015  . EYE SURGERY     cataract  . FOOT SURGERY    . JOINT REPLACEMENT     thr,tkr  . KNEE SURGERY    . NECK SURGERY    . TOTAL HIP ARTHROPLASTY     right    Family History  Problem Relation Age of Onset  . Heart disease Son   . Hypertension Son   . Kidney cancer Brother   . Bladder Cancer Neg Hx     Social History   Tobacco Use  . Smoking status: Never Smoker  . Smokeless tobacco: Never Used  Substance Use Topics  . Alcohol use: No  . Drug use: No    No current facility-administered medications for this encounter.  Current Outpatient Medications:  .  acetaminophen (TYLENOL) 325 MG tablet, Take 650 mg by mouth every 6 (six) hours as needed., Disp: , Rfl:  .  aspirin EC 81 MG tablet, Take 1 tablet (81 mg total) by mouth daily., Disp: 90 tablet, Rfl: 3 .  baclofen (LIORESAL) 10  MG tablet, Take 1 tablet by mouth daily as needed., Disp: , Rfl:  .  clopidogrel (PLAVIX) 75 MG tablet, Take 1 tablet by mouth daily., Disp: , Rfl:  .  diltiazem (DILACOR XR) 240 MG 24 hr capsule, TAKE ONE (1) CAPSULE EACH DAY., Disp: 30 capsule, Rfl: 3 .  furosemide (LASIX) 20 MG tablet, Take 1 tablet (20 mg total) by mouth 2 (two) times daily as needed., Disp: 60 tablet, Rfl: 3 .  meclizine (ANTIVERT) 25 MG tablet, Take 25 mg by mouth 3 (three) times daily as needed. , Disp: , Rfl:  .  Multiple Vitamin (MULTIVITAMIN) tablet, Take 1 tablet by mouth daily., Disp: , Rfl:  .  olmesartan (BENICAR) 40 MG tablet, Take 40 mg by mouth daily., Disp: , Rfl:  .  PREMARIN vaginal cream, Place 1 Applicatorful vaginally as needed. , Disp: , Rfl:  .   propranolol (INDERAL) 20 MG tablet, Take 1 tablet (20 mg total) by mouth 3 (three) times daily as needed., Disp: 90 tablet, Rfl: 1 .  psyllium (REGULOID) 0.52 G capsule, Take 0.52 g by mouth every other day., Disp: , Rfl:  .  rosuvastatin (CRESTOR) 10 MG tablet, Take 10 mg by mouth daily., Disp: , Rfl:  .  ciprofloxacin (CIPRO) 250 MG tablet, Take 1 tablet (250 mg total) by mouth daily for 5 days., Disp: 5 tablet, Rfl: 0 .  nitroGLYCERIN (NITROSTAT) 0.4 MG SL tablet, Place 1 tablet (0.4 mg total) under the tongue every 5 (five) minutes as needed for chest pain., Disp: 25 tablet, Rfl: 3  Allergies  Allergen Reactions  . Celebrex [Celecoxib]   . Codeine   . Gabapentin   . Ivp Dye [Iodinated Diagnostic Agents]   . Keflex [Cephalexin]   . Penicillins     Has patient had a PCN reaction causing immediate rash, facial/tongue/throat swelling, SOB or lightheadedness with hypotension: Unknown Has patient had a PCN reaction causing severe rash involving mucus membranes or skin necrosis: Unknown Has patient had a PCN reaction that required hospitalization: Unknown Has patient had a PCN reaction occurring within the last 10 years: Unknown If all of the above answers are "NO", then may proceed with Cephalosporin use. CHILDHOOD ALLERGY  . Sulfa Antibiotics      ROS  As noted in HPI.   Physical Exam  BP (!) 177/89 (BP Location: Right Arm)   Pulse (!) 116   Temp 98.5 F (36.9 C) (Oral)   Resp 18   Ht _0  (1.575 m)   Wt 72.6 kg   SpO2 99%   BMI 29.26 kg/m   Constitutional: Well developed, well nourished, no acute distress. appears well Eyes: PERRL, EOMI, conjunctiva normal bilaterally, no photophobia HENT: Normocephalic, atraumatic,mucus membranes moist Respiratory: Clear to auscultation bilaterally, no rales, no wheezing, no rhonchi Cardiovascular: Irregularly irregular, no murmurs, no gallops, no rubs GI: Soft, nondistended, normal bowel sounds, nontender, no rebound, no  guarding Back: no CVAT skin: No rash, skin intact Musculoskeletal: Bilateral lower extremity edema 1-2+ patient states that this is not new, calves symmetric, no deformities Neurologic: Alert & oriented x 3, CN III-XII  intact, no motor deficits, sensation grossly intact no pronator drift upper or lower extremities.  Grip strength 5/5 and equal bilaterally.  Patient able to ambulate to the bathroom.   Psychiatric: Speech and behavior appropriate   ED Course   Medications - No data to display  Orders Placed This Encounter  Procedures  . SARS Coronavirus 2 Ag (30 min TAT) - Nasal  Swab (BD Veritor Kit)    Standing Status:   Standing    Number of Occurrences:   1    Order Specific Question:   Is this test for diagnosis or screening    Answer:   Diagnosis of ill patient    Order Specific Question:   Symptomatic for COVID-19 as defined by CDC    Answer:   Yes    Order Specific Question:   Date of Symptom Onset    Answer:   08/24/2019    Order Specific Question:   Hospitalized for COVID-19    Answer:   No    Order Specific Question:   Admitted to ICU for COVID-19    Answer:   No    Order Specific Question:   Previously tested for COVID-19    Answer:   No    Order Specific Question:   Resident in a congregate (group) care setting    Answer:   No    Order Specific Question:   Employed in healthcare setting    Answer:   No    Order Specific Question:   Pregnant    Answer:   No  . Urine culture    Standing Status:   Standing    Number of Occurrences:   1  . Urinalysis, Complete w Microscopic    Standing Status:   Standing    Number of Occurrences:   1  . Glucose, capillary    Standing Status:   Standing    Number of Occurrences:   1  . CBC with Differential    Standing Status:   Standing    Number of Occurrences:   1  . Basic metabolic panel    Standing Status:   Standing    Number of Occurrences:   1  . CBG monitoring, ED    Standing Status:   Standing    Number of  Occurrences:   1  . ED EKG    Standing Status:   Standing    Number of Occurrences:   1    Order Specific Question:   Reason for Exam    Answer:   Weakness  . EKG 12-Lead    Standing Status:   Standing    Number of Occurrences:   1  . ED EKG    Standing Status:   Standing    Number of Occurrences:   1    Order Specific Question:   Reason for Exam    Answer:   Weakness   No results found for this or any previous visit (from the past 24 hour(s)). No results found.  ED Clinical Impression  1. Urinary tract infection with hematuria, site unspecified   2. Generalized weakness   3. Anemia, unspecified type   4. Chronic kidney disease, unspecified CKD stage      ED Assessment/Plan   Outside records reviewed.  Patient called nurse triage today reporting 2 weeks of with dizziness, was instructed to go to the ED.  Patient denies dizziness to me today.  Her main complaint here is weakness.  She is completely neurologically intact, lungs are clear, satting 99% on room air, doubt pneumonia.  Her EKG shows atrial fibrillation and is unchanged from 07/2016.  She appears stable.  Will initiate basic work-up here.  Covid negative.  She is anemic 11.9, down from 14.5 1-year ago.  Her baseline seems to be around 14.  Unsure how long this is been going on, but suspect that this  is chronic.  Patient denies hematochezia, melena, epistaxis, hematuria, vaginal bleeding. her creatinine is 1.43, baseline is around 1.4.  Her urine has leukocytes, trace hematuria, rare bacteria, UTI could be the cause of her symptoms.  Will send off for culture to confirm diagnosis and antibiotic choice.  Patient reports "bad allergic reaction" with penicillins does not remember if it was anaphylaxis or not and also has Keflex listed as an allergy.  It appears that she was seen here in 2017 with a UTI and was sent home with Cipro.  She states that that worked well for her.  Macrobid contraindicated due to the renal  insufficiency.  Calculated creatinine clearance 29.37 mL/min  will send home with renally dosed Cipro 250 mg every 24 hours for 5 days.  Discussed labs,  MDM, treatment plan, and plan for follow-up with patient Discussed sn/sx that should prompt return to the ED gave strict ER return precautions.. patient agrees with plan.   Meds ordered this encounter  Medications  . ciprofloxacin (CIPRO) 250 MG tablet    Sig: Take 1 tablet (250 mg total) by mouth daily for 5 days.    Dispense:  5 tablet    Refill:  0    *This clinic note was created using [employer]. Therefore, there may be occasional mistakes despite careful proofreading.  ?    Melynda Ripple, MD 08/26/19 [phone]

## 2019-08-26 LAB — URINE CULTURE: Culture: <10000 — AB

## 2019-08-31 ENCOUNTER — Other Ambulatory Visit: Payer: Self-pay

## 2019-08-31 ENCOUNTER — Encounter: Payer: Self-pay | Admitting: Emergency Medicine

## 2019-08-31 ENCOUNTER — Ambulatory Visit
Admission: EM | Admit: 2019-08-31 | Discharge: 2019-08-31 | Disposition: A | Discharge status: Home / Self Care | Payer: Medicare Other | Attending: Family Medicine | Admitting: Family Medicine

## 2019-08-31 DIAGNOSIS — R531 Weakness: Secondary | ICD-10-CM | POA: Insufficient documentation

## 2019-08-31 DIAGNOSIS — M545 Low back pain: Secondary | ICD-10-CM

## 2019-08-31 LAB — URINALYSIS, COMPLETE (UACMP) WITH MICROSCOPIC
Bilirubin Urine: NEGATIVE
Glucose, UA: NEGATIVE mg/dL
Ketones, ur: NEGATIVE mg/dL
Nitrite: NEGATIVE
Protein, ur: NEGATIVE mg/dL
Specific Gravity, Urine: 1.015 (ref 1.005–1.030)
pH: 7 (ref 5.0–8.0)

## 2019-08-31 MED ORDER — TRAZODONE HCL 50 MG PO TABS: 50.0000 mg | ORAL_TABLET | Freq: Every day | ORAL | 0 refills | Status: AC

## 2019-08-31 NOTE — ED Triage Notes (Signed)
Patient was here for a UTI on 12/31. She states her symptoms improved while she was on the antibiotics until 1/4. She states since she has finished her medications her weakness has returned, no appetite and having back pain.

## 2019-08-31 NOTE — Discharge Instructions (Addendum)
There is no evidence of UTI. No indication for more antibiotics.  Try the medication for sleep (if needed).  Call Dr. Iona Beard for follow up.  Take care  Dr. Lacinda Axon

## 2019-08-31 NOTE — ED Provider Notes (Signed)
[facility]    CSN: [id number] Arrival date & time: 08/31/19  1019      History   Chief Complaint Chief Complaint  Patient presents with  . Weakness  . Back Pain   HPI  86 year old female presents with multiple complaints.  Patient states that she was recently treated for UTI.  I reviewed her chart and her urine culture was negative.  She has completed a course of Cipro.  She states that she felt better on the medication and after she completed that she began to feel worse again.  She reports weakness, decreased appetite, and she has had a twinge of back pain.  She states that she is not sleeping well.  She has no urinary symptoms.  No fever.  She believes that she has a another UTI or that it was not eradicated.  She was not informed that the culture was not consistent with UTI.  No other associated symptoms.  No other complaints.  PMH, Surgical Hx, Family Hx, Social History reviewed and updated as below.  Past Medical History:  Diagnosis Date  . A-fib ([place])   . Anginal pain ([place])   . Cancer ([place])    history nose  . Depression   . Diverticulosis   . Dysrhythmia    afib  . GERD (gastroesophageal reflux disease)   . Hematochezia   . Hemorrhoids   . Hyperlipidemia   . Hypertension   . Orthopnea   . Osteoarthritis    ra  . Palpitations   . Shortness of breath dyspnea   . Swelling   . Syncope   . Wheezing     Patient Active Problem List   Diagnosis Date Noted  . Basal cell carcinoma 05/27/2016  . Enthesopathy of hip region 05/27/2016  . Neuralgia, neuritis, and radiculitis, unspecified 05/27/2016  . Osteoarthrosis, unspecified whether generalized or localized, pelvic region and thigh 05/27/2016  . Rheumatoid arthritis ([place]) 05/27/2016  . Synovitis and tenosynovitis 05/27/2016  . Inflammatory polyarthropathy ([place]) 05/27/2016  . Shortness of breath 01/01/2016  . Abdominal pain 07/02/2015  . Chronic diastolic CHF (congestive heart failure) ([place])  02/09/2014  . GI bleed 02/08/2014  . Unstable gait 12/14/2013  . Hematoma of lower limb 09/29/2013  . Atrial fibrillation ([place]) 06/20/2013  . Benign essential hypertension 06/20/2013  . Leg edema 06/20/2013  . Chronic kidney disease 06/20/2013  . Hyperlipidemia, unspecified 06/20/2013    Past Surgical History:  Procedure Laterality Date  . ABDOMINAL HYSTERECTOMY    . BACK SURGERY     lumbar,neck  . CARDIOVERSION  2015  . CATARACT EXTRACTION W/PHACO Left 01/08/2015   Procedure: CATARACT EXTRACTION PHACO AND INTRAOCULAR LENS PLACEMENT (IOC);  Surgeon: Birder Robson, MD;  Location: [facility] ORS;  Service: Ophthalmology;  Laterality: Left;  [place] 01:02 AP% 21.8 CDE 13.69  . COLONOSCOPY  2015  . EYE SURGERY     cataract  . FOOT SURGERY    . JOINT REPLACEMENT     thr,tkr  . KNEE SURGERY    . NECK SURGERY    . TOTAL HIP ARTHROPLASTY     right    OB History    Gravida  3   Para  3   Term      Preterm      AB      Living  3     SAB      TAB      Ectopic      Multiple      Live  Births               Home Medications    Prior to Admission medications   Medication Sig Start Date End Date Taking? Authorizing Provider  acetaminophen (TYLENOL) 325 MG tablet Take 650 mg by mouth every 6 (six) hours as needed.   Yes [provider]  aspirin EC 81 MG tablet Take 1 tablet (81 mg total) by mouth daily. 02/22/14  Yes [provider]  baclofen (LIORESAL) 10 MG tablet Take 1 tablet by mouth daily as needed. 11/09/17  Yes [provider]  clopidogrel (PLAVIX) 75 MG tablet Take 1 tablet by mouth daily. 11/18/17  Yes [provider]  diltiazem (DILACOR XR) 240 MG 24 hr capsule TAKE ONE (1) CAPSULE EACH DAY. 12/06/15  Yes Gollan, Kathlene November, MD  furosemide (LASIX) 20 MG tablet Take 1 tablet (20 mg total) by mouth 2 (two) times daily as needed. 12/15/16  Yes Minna Merritts, MD  meclizine (ANTIVERT) 25 MG tablet Take 25 mg by mouth 3 (three) times  daily as needed.  02/14/14  Yes [provider]  Multiple Vitamin (MULTIVITAMIN) tablet Take 1 tablet by mouth daily.   Yes [provider]  nitroGLYCERIN (NITROSTAT) 0.4 MG SL tablet Place 1 tablet (0.4 mg total) under the tongue every 5 (five) minutes as needed for chest pain. 01/07/15  Yes Gollan, Kathlene November, MD  olmesartan (BENICAR) 40 MG tablet Take 40 mg by mouth daily.   Yes [provider]  PREMARIN vaginal cream Place 1 Applicatorful vaginally as needed.  02/26/14  Yes [provider]  propranolol (INDERAL) 20 MG tablet Take 1 tablet (20 mg total) by mouth 3 (three) times daily as needed. 01/07/15  Yes Gollan, Kathlene November, MD  psyllium (REGULOID) 0.52 G capsule Take 0.52 g by mouth every other day.   Yes [provider]  rosuvastatin (CRESTOR) 10 MG tablet Take 10 mg by mouth daily.   Yes [provider]  traZODone (DESYREL) 50 MG tablet Take 1 tablet (50 mg total) by mouth at bedtime. 08/31/19   Coral Spikes, DO    Family History Family History  Problem Relation Age of Onset  . Heart disease Son   . Hypertension Son   . Kidney cancer Brother   . Bladder Cancer Neg Hx     Social History Social History   Tobacco Use  . Smoking status: Never Smoker  . Smokeless tobacco: Never Used  Substance Use Topics  . Alcohol use: No  . Drug use: No     Allergies   Celebrex [celecoxib], Codeine, Gabapentin, Ivp dye [iodinated diagnostic agents], Keflex [cephalexin], Penicillins, and Sulfa antibiotics   Review of Systems Review of Systems  Constitutional: Positive for appetite change.  Genitourinary: Negative.   Neurological: Positive for weakness.   Physical Exam Triage Vital Signs ED Triage Vitals  Enc Vitals Group     BP 08/31/19 1030 (!) 147/112     Pulse Rate 08/31/19 1030 91     Resp 08/31/19 1030 18     Temp 08/31/19 1030 98.7 F (37.1 C)     Temp Source 08/31/19 1030 Oral     SpO2 08/31/19 1030 97 %     Weight 08/31/19  1030 160 lb (72.6 kg)     Height 08/31/19 1030 5\' 3"  (1.6 m)     Head Circumference --      Peak Flow --      Pain Score 08/31/19 1028 0  Pain Loc --      Pain Edu? --      Excl. in [place]? --    Updated Vital Signs BP (!) 147/112 (BP Location: Left Arm)   Pulse 91   Temp 98.7 F (37.1 C) (Oral)   Resp 18   Ht 5\' 3"  (1.6 m)   Wt 72.6 kg   SpO2 97%   BMI 28.34 kg/m   Visual Acuity Right Eye Distance:   Left Eye Distance:   Bilateral Distance:    Right Eye Near:   Left Eye Near:    Bilateral Near:     Physical Exam Vitals and nursing note reviewed.  Constitutional:      General: She is not in acute distress.    Appearance: Normal appearance. She is not ill-appearing.  HENT:     Head: Normocephalic and atraumatic.  Eyes:     General:        Right eye: No discharge.        Left eye: No discharge.     Conjunctiva/sclera: Conjunctivae normal.  Cardiovascular:     Comments: Irregularly irregular.  Chronic A. fib. Pulmonary:     Effort: Pulmonary effort is normal.     Breath sounds: Normal breath sounds. No wheezing, rhonchi or rales.  Neurological:     Mental Status: She is alert.  Psychiatric:        Mood and Affect: Mood normal.        Behavior: Behavior normal.    UC Treatments / Results  Labs (all labs ordered are listed, but only abnormal results are displayed) Labs Reviewed  URINALYSIS, COMPLETE (UACMP) WITH MICROSCOPIC - Abnormal; Notable for the following components:      Result Value   APPearance HAZY (*)    Hgb urine dipstick TRACE (*)    Leukocytes,Ua SMALL (*)    Bacteria, UA MANY (*)    All other components within normal limits  URINE CULTURE    EKG   Radiology No results found.  Procedures Procedures (including critical care time)  Medications Ordered in UC Medications - No data to display  Initial Impression / Assessment and Plan / UC Course  I have reviewed the triage vital signs and the nursing notes.  Pertinent labs &  imaging results that were available during my care of the patient were reviewed by me and considered in my medical decision making (see chart for details).    86 year old female presents with generalized weakness.  She has no evidence of UTI.  Recent culture negative.  Urinalysis today not consistent with UTI.  Awaiting final culture results.  Patient complaining of sleep disturbance amongst other things.  Trazodone as needed for sleep.  Advised to see PCP.  Supportive care.  Final Clinical Impressions(s) / UC Diagnoses   Final diagnoses:  Generalized weakness     Discharge Instructions     There is no evidence of UTI. No indication for more antibiotics.  Try the medication for sleep (if needed).  Call Dr. Iona Beard for follow up.  Take care  Dr. Lacinda Axon    ED Prescriptions    Medication Sig Dispense Auth. Provider   traZODone (DESYREL) 50 MG tablet Take 1 tablet (50 mg total) by mouth at bedtime. 30 tablet Coral Spikes, DO     PDMP not reviewed this encounter.   Coral Spikes, [place] 08/31/19 1128

## 2019-09-01 LAB — URINE CULTURE: Culture: NO GROWTH

## 2020-08-28 ENCOUNTER — Emergency Department
Admission: EM | Admit: 2020-08-28 | Discharge: 2020-08-29 | Disposition: A | Discharge status: Home / Self Care | Payer: Medicare Other | Attending: Emergency Medicine | Admitting: Emergency Medicine

## 2020-08-28 ENCOUNTER — Other Ambulatory Visit: Payer: Self-pay

## 2020-08-28 ENCOUNTER — Encounter: Payer: Self-pay | Admitting: *Deleted

## 2020-08-28 DIAGNOSIS — T7840XA Allergy, unspecified, initial encounter: Secondary | ICD-10-CM | POA: Insufficient documentation

## 2020-08-28 DIAGNOSIS — I10 Essential (primary) hypertension: Secondary | ICD-10-CM | POA: Diagnosis not present

## 2020-08-28 DIAGNOSIS — Z5321 Procedure and treatment not carried out due to patient leaving prior to being seen by health care provider: Secondary | ICD-10-CM | POA: Diagnosis not present

## 2020-08-28 LAB — BASIC METABOLIC PANEL
Anion gap: 12 (ref 5–15)
BUN: 18 mg/dL (ref 8–23)
CO2: 30 mmol/L (ref 22–32)
Calcium: 9.2 mg/dL (ref 8.9–10.3)
Chloride: 93 mmol/L — ABNORMAL LOW (ref 98–111)
Creatinine, Ser: 1.24 mg/dL — ABNORMAL HIGH (ref 0.44–1.00)
GFR, Estimated: 41 mL/min — ABNORMAL LOW (ref >60)
Glucose, Bld: 99 mg/dL (ref 70–99)
Potassium: 4 mmol/L (ref 3.5–5.1)
Sodium: 135 mmol/L (ref 135–145)

## 2020-08-28 LAB — URINALYSIS, COMPLETE (UACMP) WITH MICROSCOPIC
Bilirubin Urine: NEGATIVE
Glucose, UA: NEGATIVE mg/dL
Ketones, ur: NEGATIVE mg/dL
Leukocytes,Ua: NEGATIVE
Nitrite: NEGATIVE
Protein, ur: NEGATIVE mg/dL
Specific Gravity, Urine: 1.003 — ABNORMAL LOW (ref 1.005–1.030)
pH: 7 (ref 5.0–8.0)

## 2020-08-28 LAB — TROPONIN I (HIGH SENSITIVITY): Troponin I (High Sensitivity): 11 ng/L (ref <18)

## 2020-08-28 LAB — CBC
HCT: 43.7 % (ref 36.0–46.0)
Hemoglobin: 14.1 g/dL (ref 12.0–15.0)
MCH: 30.9 pg (ref 26.0–34.0)
MCHC: 32.3 g/dL (ref 30.0–36.0)
MCV: 95.8 fL (ref 80.0–100.0)
Platelets: 221 10*3/uL (ref 150–400)
RBC: 4.56 MIL/uL (ref 3.87–5.11)
RDW: 14.2 % (ref 11.5–15.5)
WBC: 6.9 10*3/uL (ref 4.0–10.5)
nRBC: 0 % (ref 0.0–0.2)

## 2020-08-28 MED ORDER — ALUM & MAG HYDROXIDE-SIMETH 200-200-20 MG/5ML PO SUSP
30.0000 mL | Freq: Once | ORAL | Status: AC
  Administered 2020-08-28: 30 mL via ORAL
  Filled 2020-08-28: qty 30

## 2020-08-28 NOTE — ED Notes (Signed)
Pt assisted to the bathroom to urinate. Pt wheeled back out to the lobby to wait triage.

## 2020-08-28 NOTE — ED Triage Notes (Addendum)
Pt to ED reporting she was recently dx with a UTI and placed on an antibiotic. Pt took two doses and now feels like she is "on fire and burning." Pt was instructed to come to the ED. Pt also burping in triage and reporting a burning in her chest but denies a hx of GERD. Hx of AFIB  Pt also reporting weakness and decreased mobility today.

## 2020-08-28 NOTE — ED Notes (Addendum)
First RN note:  Pt comes into the ED via [facility] from home c/o allergic reaction to possibly her UTI medication.  Pt states the symptoms are a "burning sensation on the inside". H/o HTN and a-fib. Vitals stable except BP was 161/73.

## 2020-08-30 LAB — URINE CULTURE: Culture: NO GROWTH
